# Patient Record
Sex: Female | Born: 1945
Health system: Southern US, Community
[De-identification: ages and names within clinical notes are randomized; demographics above are authoritative.]

## PROBLEM LIST (undated history)

## (undated) DIAGNOSIS — M810 Age-related osteoporosis without current pathological fracture: Secondary | ICD-10-CM

## (undated) DIAGNOSIS — R51 Headache: Secondary | ICD-10-CM

## (undated) DIAGNOSIS — K5790 Diverticulosis of intestine, part unspecified, without perforation or abscess without bleeding: Secondary | ICD-10-CM

## (undated) DIAGNOSIS — G4730 Sleep apnea, unspecified: Secondary | ICD-10-CM

## (undated) DIAGNOSIS — F329 Major depressive disorder, single episode, unspecified: Secondary | ICD-10-CM

## (undated) DIAGNOSIS — R9089 Other abnormal findings on diagnostic imaging of central nervous system: Secondary | ICD-10-CM

## (undated) DIAGNOSIS — Z8249 Family history of ischemic heart disease and other diseases of the circulatory system: Secondary | ICD-10-CM

## (undated) DIAGNOSIS — K219 Gastro-esophageal reflux disease without esophagitis: Secondary | ICD-10-CM

## (undated) DIAGNOSIS — E782 Mixed hyperlipidemia: Secondary | ICD-10-CM

## (undated) DIAGNOSIS — F32A Depression, unspecified: Secondary | ICD-10-CM

## (undated) DIAGNOSIS — I739 Peripheral vascular disease, unspecified: Secondary | ICD-10-CM

## (undated) HISTORY — DX: Other abnormal findings on diagnostic imaging of central nervous system: R90.89

## (undated) HISTORY — PX: ABDOMINAL HYSTERECTOMY: SHX81

## (undated) HISTORY — DX: Family history of ischemic heart disease and other diseases of the circulatory system: Z82.49

## (undated) HISTORY — PX: BLADDER SURGERY: SHX569

## (undated) HISTORY — DX: Age-related osteoporosis without current pathological fracture: M81.0

## (undated) HISTORY — DX: Diverticulosis of intestine, part unspecified, without perforation or abscess without bleeding: K57.90

## (undated) HISTORY — DX: Mixed hyperlipidemia: E78.2

## (undated) HISTORY — DX: Gastro-esophageal reflux disease without esophagitis: K21.9

## (undated) HISTORY — DX: Peripheral vascular disease, unspecified: I73.9

## (undated) HISTORY — DX: Headache: R51

## (undated) HISTORY — PX: MANDIBLE FRACTURE SURGERY: SHX706

## (undated) HISTORY — DX: Depression, unspecified: F32.A

## (undated) HISTORY — PX: CERVICAL SPINE SURGERY: SHX589

## (undated) HISTORY — DX: Major depressive disorder, single episode, unspecified: F32.9

---

## 1998-12-18 ENCOUNTER — Other Ambulatory Visit: Admission: RE | Admit: 1998-12-18 | Discharge: 1998-12-18 | Payer: Self-pay | Admitting: *Deleted

## 1999-12-24 ENCOUNTER — Other Ambulatory Visit: Admission: RE | Admit: 1999-12-24 | Discharge: 1999-12-24 | Payer: Self-pay | Admitting: *Deleted

## 2001-01-13 ENCOUNTER — Other Ambulatory Visit: Admission: RE | Admit: 2001-01-13 | Discharge: 2001-01-13 | Payer: Self-pay | Admitting: *Deleted

## 2002-02-01 ENCOUNTER — Other Ambulatory Visit: Admission: RE | Admit: 2002-02-01 | Discharge: 2002-02-01 | Payer: Self-pay | Admitting: *Deleted

## 2003-02-20 ENCOUNTER — Other Ambulatory Visit: Admission: RE | Admit: 2003-02-20 | Discharge: 2003-02-20 | Payer: Self-pay | Admitting: *Deleted

## 2004-02-28 ENCOUNTER — Other Ambulatory Visit: Admission: RE | Admit: 2004-02-28 | Discharge: 2004-02-28 | Payer: Self-pay | Admitting: *Deleted

## 2004-03-03 ENCOUNTER — Encounter: Admission: RE | Admit: 2004-03-03 | Discharge: 2004-03-03 | Payer: Self-pay | Admitting: *Deleted

## 2005-03-02 ENCOUNTER — Other Ambulatory Visit: Admission: RE | Admit: 2005-03-02 | Discharge: 2005-03-02 | Payer: Self-pay | Admitting: *Deleted

## 2006-03-23 ENCOUNTER — Other Ambulatory Visit: Admission: RE | Admit: 2006-03-23 | Discharge: 2006-03-23 | Payer: Self-pay | Admitting: *Deleted

## 2007-04-14 ENCOUNTER — Other Ambulatory Visit: Admission: RE | Admit: 2007-04-14 | Discharge: 2007-04-14 | Payer: Self-pay | Admitting: *Deleted

## 2007-05-27 ENCOUNTER — Ambulatory Visit: Payer: Self-pay | Admitting: Internal Medicine

## 2007-06-09 ENCOUNTER — Ambulatory Visit (HOSPITAL_COMMUNITY): Admission: RE | Admit: 2007-06-09 | Discharge: 2007-06-09 | Payer: Self-pay | Admitting: Internal Medicine

## 2007-06-09 ENCOUNTER — Ambulatory Visit: Payer: Self-pay | Admitting: Internal Medicine

## 2007-06-09 HISTORY — PX: COLONOSCOPY: SHX174

## 2008-06-20 ENCOUNTER — Ambulatory Visit (HOSPITAL_COMMUNITY): Admission: RE | Admit: 2008-06-20 | Discharge: 2008-06-20 | Payer: Self-pay | Admitting: Family Medicine

## 2008-07-30 ENCOUNTER — Other Ambulatory Visit: Admission: RE | Admit: 2008-07-30 | Discharge: 2008-07-30 | Payer: Self-pay | Admitting: Gynecology

## 2010-03-04 ENCOUNTER — Ambulatory Visit (HOSPITAL_COMMUNITY): Admission: RE | Admit: 2010-03-04 | Discharge: 2010-03-04 | Payer: Self-pay | Admitting: Family Medicine

## 2011-05-12 NOTE — Consult Note (Signed)
NAME:  ALIZON, SCHMELING                 ACCOUNT NO.:  [id number]   MEDICAL RECORD NO.:  [id number]           PATIENT TYPE:  AMB   LOCATION:                                FACILITY:  [facility]   PHYSICIAN:  R. Roetta Sessions, M.D. DATE OF BIRTH:  03/23/1942   DATE OF CONSULTATION:  05/27/2007  DATE OF DISCHARGE:                                 CONSULTATION   REASON FOR CONSULTATION:  Change in bowel habits.   REFERRING PHYSICIAN:  Scott A. Gerda Diss, M.D.   Dictation Ended At [employer]. Roetta Sessions, M.D.  Electronically Signed     RMR/MEDQ  D:  05/27/2007  T:  05/27/2007  Job:  [id number]

## 2011-05-12 NOTE — Op Note (Signed)
NAME:  Rachael Matthews, Rachael Matthews                 ACCOUNT NO.:  [id number]   MEDICAL RECORD NO.:  [id number]          PATIENT TYPE:  AMB   LOCATION:  DAY                           FACILITY:  [facility]   PHYSICIAN:  R. Roetta Sessions, M.D. DATE OF BIRTH:  Dec 10, 1946   DATE OF PROCEDURE:  06/09/2007  DATE OF DISCHARGE:                                PROCEDURE NOTE   PROCEDURE:  Diagnostic colonoscopy and ileoscopy.   ENDOSCOPIST:  Jonathon Bellows, M.D.   INDICATIONS FOR PROCEDURE:  [age]-year-old lady with a 4-month  history of intermittent postprandial diarrhea and other days she does  not have any diarrhea and days she does not have a bowel movement.  She  has not passed any blood per rectum.  She never had her lower GI tract  imaged.  There is no family history of colorectal neoplasia.  Colonoscopy is now being done.  The potential risks, benefits and  alternatives have been reviewed, questions answered.  She was having  some reflux symptoms when I saw her in the office without any alarm  features; those symptoms have been abolished with AcipHex 20 mg orally  daily.   PROCEDURE NOTE:  O2 saturation, blood pressure, pulse and respiration  were monitored throughout the entire procedure.   CONSCIOUS SEDATION:  Versed 4 mg IV, Demerol 75 mg IV in divided doses.   INSTRUMENT:  Olympus video chip system.   FINDINGS:  Digital rectal exam revealed no abnormalities.   ENDOSCOPIC FINDINGS:  The prep was adequate.   COLON:  The colonic mucosa was surveyed from the rectosigmoid junction  through the left, transverse and right colon to area of the appendiceal  orifice, ileocecal valve and cecum.  These structures were well seen and  photographed for the record.  The terminal ileum was then measured at 10  cm.  From this level, the scope slowly and cautiously withdrawn.  All  previously mentioned mucosal surfaces were again seen.  The colonic  mucosa appeared normal.  The scope was pulled down into to  the rectum,  where a thorough examination of the rectal mucosa including a  retroflexed view of the anal verge demonstrated only some anal papillae.  The patient tolerated the procedure well and was reactive after  endoscopy.   IMPRESSION:  Anal papillae, otherwise normal rectum, colon and terminal  ileum.   RECOMMENDATIONS:  We will begin a daily fiber supplement with Benefiber  one tablespoon daily.  She is to continue AcipHex 20 mg orally daily for  gastroesophageal reflux disease.  I plan to see this nice lady back in  followup in the office in 6-9 weeks to see how she is doing.      Jonathon Bellows, M.D.  Electronically Signed     RMR/MEDQ  D:  06/09/2007  T:  06/10/2007  Job:  [id number]

## 2011-05-12 NOTE — Consult Note (Signed)
NAME:  Rachael Matthews, Rachael Matthews                 ACCOUNT NO.:  [id number]   MEDICAL RECORD NO.:  [id number]           PATIENT TYPE:  AMB   LOCATION:                                FACILITY:  [facility]   PHYSICIAN:  R. Roetta Sessions, M.D. DATE OF BIRTH:  06/12/1946   DATE OF CONSULTATION:  05/27/2007  DATE OF DISCHARGE:                                 CONSULTATION   REASON FOR CONSULTATION:  Altered bowel habits, need for colonoscopy.   REFERRING PHYSICIAN:  Scott A. Gerda Diss, MD   HISTORY OF PRESENT ILLNESS:  Ms. Rachael Matthews is a very pleasant 65-year-  old Caucasian female sent over through the courtesy of Dr. Lilyan Punt  to further evaluate a 2-month to 1-year history of vague intermittent  postprandial abdominal cramps, followed by a strong urge to have a bowel  movement.  Symptoms are intermittent, not every day, but typically occur  after eating.  She may have a formed to semiformed to almost loose,  nonbloody bowel movement in rapid succession over subsequent 30 minutes  to an hour and may have 2 or 3 additional bowel movements, then symptoms  settle down.  There are days when she has 1 formed bowel movement  without any abdominal discomfort whatsoever.  There are even a few days  along the way where she does not have a bowel movement at all.  She has  never had any blood per rectum, no melena.  She has never had her lower  GI tract imaged.  There is no family history of colorectal neoplasia.   This nice lady tells me she has gained about 10 pounds in the past year.   She described also heartburn, which she has every morning and  intermittently throughout the day.  She has not had any odynophagia or  dysphagia.  She had been on Boniva previously.  After taking oral Boniva  she had significant worsening of her reflux symptoms.  Although they  have now settled down, they still persist.  She had been taking Zantac  150 mg orally twice daily.  She does not smoke or use alcohol.   PAST MEDICAL  HISTORY:  1. Some memory problems.  2. Hypercholesterolemia.   PAST SURGERY:  1. Hysterectomy.  2. Bladder repair.  3. Cervical spine surgery.   CURRENT MEDICATIONS:  1. Cymbalta 30 mg daily.  2. Zantac 150 mg b.i.d.  3. Meloxicam 7.5 mg b.i.d.  4. Amoxicillin 500 mg daily.  5. Claritin daily.  6. Os-Cal 500 mg b.i.d.  7. Coenzyme Q-10 100 mg b.i.d.  8. Vitamin E 400 units daily.  9. Omega-3 fish oil three capsules daily.  10.Super B complex daily.  11.Red rice yeast 600 mg two daily.   ALLERGIES:  CODEINE, PHENERGAN.   FAMILY HISTORY:  Her father died with MI at age 81.  Mother is alive at  age 7 with congestive heart failure and hypertension.  No history of  chronic GI or liver illness.   SOCIAL HISTORY:  The patient is married.  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  No  tobacco, no alcohol, no illicit drugs.   REVIEW OF SYSTEMS:  No recent chest pain or dyspnea on exertion.  Weight  gain as outlined above.  No fever or chills.   PHYSICAL EXAMINATION:  GENERAL:  This is a pleasant 65 year old lady  resting comfortably.  VITAL SIGNS:  Weight 145.  Height 5 feet.  Temperature 97.9, BP 128/80,  pulse 72.  SKIN:  Warm and dry.  There is no jaundice or cutaneous stigmata of  chronic liver disease.  HEENT:  No scleral icterus.  JVD is not prominent.  Oral cavity:  No  lesions.  CHEST:  Lungs are clear to auscultation.  CARDIAC:  Regular rate and rhythm without murmur, gallop, or rub.  ABDOMEN:  Nondistended, positive bowel sounds, soft, nontender, without  appreciable mass or organomegaly.  EXTREMITIES:  No edema.  RECTAL:  Deferred to time of colonoscopy.   IMPRESSION:  Ms. Rachael Matthews is a very pleasant 65 year old lady with a  65-month to year history of intermittent postprandial diarrhea.  Symptoms  are intermittent.  Abdominal cramps are temporally related to the need  to have a bowel movement and are  relieved with defecation.  There are  days when she has no symptoms and normal bowel movements.  There are  days in between when she has no bowel movement.  Almost certainly this  points to a functional disorder.   I am struck by the fact that she is 45 and has never had her lower GI  tract imaged.  Before making further recommendations, I have strongly  encouraged Ms. Ringwald to go ahead and have a screening colonoscopy.  The  potential risks, benefits, and alternatives of this approach have been  reviewed.  Her questions were answered.  She was agreeable.  I will plan  to perform a colonoscopy in the very near future.  She does have  frequent reflux symptoms, which were significantly worsened with a  course of Boniva.  I do not detect any alarm features at this point in  time.  I have recommended that Ms. Polidore simply go on a proton pump  inhibitor for the next month and see how that works for this nice lady.  I have taken the liberty of giving her 30 20-mg Aciphex tablets one  tablet  each morning.  I have also given her literature on gastroesophageal  reflux disease.  I will make further recommendations in the very near  future.   I would like to thank Dr. Lilyan Punt for allowing me to see this nice  lady today.      Jonathon Bellows, M.D.  Electronically Signed     RMR/MEDQ  D:  05/27/2007  T:  05/27/2007  Job:  [id number]   cc:   Lorin Picket A. Gerda Diss, MD  Fax: [phone]

## 2011-07-23 ENCOUNTER — Ambulatory Visit: Payer: Self-pay | Admitting: Cardiology

## 2011-07-31 ENCOUNTER — Encounter: Payer: Self-pay | Admitting: Cardiology

## 2011-07-31 ENCOUNTER — Ambulatory Visit (INDEPENDENT_AMBULATORY_CARE_PROVIDER_SITE_OTHER): Payer: 59 | Admitting: Cardiology

## 2011-07-31 DIAGNOSIS — Z8249 Family history of ischemic heart disease and other diseases of the circulatory system: Secondary | ICD-10-CM

## 2011-07-31 DIAGNOSIS — E782 Mixed hyperlipidemia: Secondary | ICD-10-CM

## 2011-07-31 DIAGNOSIS — R0602 Shortness of breath: Secondary | ICD-10-CM

## 2011-07-31 NOTE — Assessment & Plan Note (Signed)
Progressive over the last 6 months, also associated with fatigue, no exertional chest pain. She has had some exertional dizziness. Resting ECG is normal. Family history of premature cardiovascular disease is noted, as well as personal history of significant hyperlipidemia, with statin intolerance demonstrated. No significant cardiac murmur on exam. Plan is to proceed with an exercise echocardiogram for ischemic evaluation. Followup arranged.

## 2011-07-31 NOTE — Assessment & Plan Note (Signed)
Significant, as noted above. We discussed the situation today, reviewed previous medications that she has attempted for treatment, and plan at this point is to refer her to our lipid clinic for further evaluation and management.

## 2011-07-31 NOTE — Patient Instructions (Signed)
Your physician has requested that you have en exercise stress myoview. For further information please visit [email or web address] Please follow instruction sheet, as given.  Your physician is referring you to our Lipid Clinic   Your physician recommends that you schedule a follow-up appointment in: 1 month

## 2011-07-31 NOTE — Progress Notes (Signed)
Clinical Summary Rachael Matthews is a 65 y.o.female referred for cardiology consultation by Dr. Gerda Diss. She reports a six-month history of progressive exertional fatigue. Although she admits that she has not been as regular with exercise, with her typical activities she has been more fatigued, sometimes "exhausted", other times dizzy. She reports this with activities such as walking on the beach, going up steps, and recently trying to walk on the treadmill. She does not endorse any exertional chest pain.  Recent lab work from June showed total cholesterol 316, HDL 46, triglycerides 145, LDL 241, glucose 90, BUN 18, creatinine 0.6, sodium 142, potassium 4.4, AST 16, ALT 10. She states that she took Pravachol years ago, reportedly without difficulty, however was subsequently switched to Lipitor, and then Vytorin, having to discontinue these medications related to a feeling of dizziness and also poor memory. She has been hesitant to resume any additional statin therapy. She is taking omega-3 supplements intermittently.  Fax copy of ECG from July 2 shows normal sinus rhythm at 68 beats per minute.  She does have family history of premature cardiovascular disease, noted below. She reports no previous ischemic testing.   No Known Allergies  Medication list reviewed.  Past Medical History  Diagnosis Date  . Mixed hyperlipidemia   . GERD (gastroesophageal reflux disease)   . Depression     Past Surgical History  Procedure Date  . Abdominal hysterectomy   . Bladder surgery   . Cervical spine surgery     Family History  Problem Relation Age of Onset  . Coronary artery disease Father     Died with MI age 95  . Hypertension Mother   . Heart failure Mother     Social History Ms. Marshburn reports that she has never smoked. She has never used smokeless tobacco. Ms. Rahimi reports that she does not drink alcohol.  Review of Systems No palpitations or syncope. No orthopnea or PND. No lower extremity  edema. Appetite stable. Otherwise negative.  Physical Examination Filed Vitals:   07/31/11 0843  BP: 128/73  Pulse: 69  Normally nourished appearing woman in no acute distress. HEENT: Conjunctiva and lids normal, oropharynx with moist mucosa. Neck: Supple, no elevated JVP or carotid bruits. Lungs: Clear to auscultation, nonlabored. Cardiac: Regular rate and rhythm, no significant murmur or S3 gallop. Abdomen: Soft, nontender, no bruits. Skin: Warm and dry. Extremities: No pitting edema, distal pulses full. Musculoskeletal: No kyphosis. Neuropsychiatric: Alert and oriented x3, affect appropriate.   ECG Normal sinus rhythm at 64 beats per minute.    Problem List and Plan

## 2011-08-10 ENCOUNTER — Ambulatory Visit (INDEPENDENT_AMBULATORY_CARE_PROVIDER_SITE_OTHER): Payer: Medicare Other

## 2011-08-10 VITALS — BP 132/94 | Wt 134.0 lb

## 2011-08-10 DIAGNOSIS — E782 Mixed hyperlipidemia: Secondary | ICD-10-CM

## 2011-08-10 MED ORDER — FENOFIBRATE 145 MG PO TABS: 145.0000 mg | ORAL_TABLET | Freq: Every day | ORAL | Status: DC

## 2011-08-10 NOTE — Progress Notes (Signed)
Rachael Matthews is a 65yoF presenting for her initial Lipid Clinic visit after being referred by Dr. Diona Browner. Her most recent labs were: TChol 316, Trig 145, HDL 46, LDL 241, AST 16, ALT 10.   Patient has no significant medical history and is not taking any medications regularly. Patient reports previously having tried Pravachol, Lipitor, Vytorin, niacin and red yeast rice for her hyperlipidemia, but discontinued the medications due to significant memory problems that presented while on the medications (couldn't remember how to do work tasks, how to pay bills, and was required to retire early). Patient has also tried omega-3-fatty acids but did not like taking them. Memory problems have improved since discontinuing the cholesterol medications but also reports having some issues with other medications (such as Advil liquid-gels). She is willing to try other cholesterol medications, but does not want to restart a statin.  FH: Father died of a massive MI at 38yo; Mother (living) had a triple bi-pass in her 57's and has HTN, hyperlipidemia, alzheimer's; One brother had quadruple bi-pass and HTN; and a sister and other brother have HTN  SH: Currently works part-time 5 months of the year; Denies any EtOH and smoking (smoked as a teenager)  Review of Diet: Breakfast: plain yogurt chopped strawberries, oatmeal, or occasional boiled egg. Lunch: If working, salad or pizza (pizza/salad days split half/half); when at home, usually skips or has crackers with peanut butter. Dinner: salads with grilled chicken (with thousand island dressing), fish, steak, pork chops, occasionally pasta. Rarely eats fried foods. For a snack, she will occasionally have cookies.  Review of exercise: No regular exercise; reports having no restrictions for exercise but that she just doesn't do it. Has a treadmill at home but gets bored using it; had a gym membership in the past but does not have one currently. Reports feeling unsafe to walk by  herself around her house. Discussed tried to get her husband involved in exercise/walking with her.  Current Outpatient Prescriptions on File Prior to Visit  Medication Sig Dispense Refill  . Ibuprofen 200 MG CAPS Take by mouth as needed.         No Known Allergies

## 2011-08-10 NOTE — Assessment & Plan Note (Signed)
Assessment: Rachael Matthews is a 65yoF with uncontrolled hyperlipidemia. TChol 316 (Goal <200), LDL 241 (Goal <130),Trig 145 (Goal <150), HDL 46 (Goal >45). Patient is unable to tolerate statins (experiences memory problems), but is willing to try other medications. Patient diet is fairly healthy, but she does not exercise. We discussed healthier dietary options (leaner meats, less/low fat salad dressing, lower fat) and different options for exercise--cardio, strength training, getting her husband involved, and possibly getting a gym membership again.  Plan: 1) Start taking Tricor 145mg  tablets: One tablet by mouth daily (#42 tablets SAMPLE given). 2) Increase exercise up to 30 min x 5 days/week as tolerated. (Patient's goal: increase walking to 2 miles/day most days of the week). 3) Continue to try to choose healthier diet options. 4) Follow-up 09/24/11 at 10am; Get fasting labs drawn a few days prior to clinic visit (prescription for labs given to patient).

## 2011-08-10 NOTE — Patient Instructions (Signed)
1) Start taking Tricor 145mg  tablets by mouth once daily. 2) Try to increase weekly exercise to 5 days per week for 30 minutes a week as tolerated.  (Your Goal: work up to 2 miles per day most days of the week). 3) Continue choosing healthy dietary options. 4) Lipid Clinic follow-up in 6 weeks (09/24/11 at 10am). Get blood work a few days before your appointment.

## 2011-08-11 ENCOUNTER — Ambulatory Visit (HOSPITAL_COMMUNITY)
Admission: RE | Admit: 2011-08-11 | Discharge: 2011-08-11 | Disposition: A | Discharge status: Home / Self Care | Payer: Medicare Other | Source: Ambulatory Visit | Attending: Cardiology | Admitting: Cardiology

## 2011-08-11 ENCOUNTER — Encounter (HOSPITAL_COMMUNITY): Payer: Self-pay | Admitting: Cardiology

## 2011-08-11 DIAGNOSIS — R0602 Shortness of breath: Secondary | ICD-10-CM

## 2011-08-11 DIAGNOSIS — R0989 Other specified symptoms and signs involving the circulatory and respiratory systems: Secondary | ICD-10-CM | POA: Insufficient documentation

## 2011-08-11 DIAGNOSIS — R0609 Other forms of dyspnea: Secondary | ICD-10-CM | POA: Insufficient documentation

## 2011-08-11 NOTE — Progress Notes (Signed)
Stress Lab Nurses Notes - Khadijah Mastrianni 08/11/2011  Reason for doing test: Dyspnea  Type of test: Stress Echo  Nurse performing test: Parke Poisson, RN  Nuclear Medicine Tech: Not Applicable  Echo Tech: Karrie Doffing  MD performing test: R. Dietrich Pates  Family MD: Lilyan Punt  Test explained and consent signed: yes  IV started: No IV started  Symptoms: Fatigue and mild SOB  Treatment/Intervention: None  Reason test stopped: fatigue  After recovery IV was: NA  Patient to return to Nuc. Med at :NA  Patient discharged: Home  Patient's Condition upon discharge was: stable  Comments: Symptoms resolved in recovery.  Erskine Speed T

## 2011-08-11 NOTE — Progress Notes (Signed)
*  PRELIMINARY RESULTS* Echocardiogram Echocardiogram Stress Test has been performed.  Rachael Matthews 08/11/2011, 11:26 AM

## 2011-09-03 ENCOUNTER — Ambulatory Visit: Payer: 59 | Admitting: Cardiology

## 2011-09-24 ENCOUNTER — Ambulatory Visit: Payer: Medicare Other

## 2011-09-25 ENCOUNTER — Encounter: Payer: Self-pay | Admitting: Cardiology

## 2012-10-04 ENCOUNTER — Other Ambulatory Visit: Payer: Self-pay | Admitting: Family Medicine

## 2012-10-04 DIAGNOSIS — R270 Ataxia, unspecified: Secondary | ICD-10-CM

## 2012-10-06 ENCOUNTER — Ambulatory Visit (HOSPITAL_COMMUNITY)
Admission: RE | Admit: 2012-10-06 | Discharge: 2012-10-06 | Disposition: A | Discharge status: Home / Self Care | Payer: Medicare Other | Source: Ambulatory Visit | Attending: Family Medicine | Admitting: Family Medicine

## 2012-10-06 DIAGNOSIS — R42 Dizziness and giddiness: Secondary | ICD-10-CM | POA: Insufficient documentation

## 2012-10-06 DIAGNOSIS — R270 Ataxia, unspecified: Secondary | ICD-10-CM

## 2012-10-06 DIAGNOSIS — R279 Unspecified lack of coordination: Secondary | ICD-10-CM | POA: Insufficient documentation

## 2012-12-23 ENCOUNTER — Other Ambulatory Visit (HOSPITAL_COMMUNITY): Payer: Self-pay | Admitting: Neurology

## 2012-12-23 DIAGNOSIS — I6330 Cerebral infarction due to thrombosis of unspecified cerebral artery: Secondary | ICD-10-CM

## 2013-01-05 ENCOUNTER — Ambulatory Visit (HOSPITAL_COMMUNITY): Payer: Medicare Other | Attending: Cardiology

## 2013-01-05 DIAGNOSIS — I369 Nonrheumatic tricuspid valve disorder, unspecified: Secondary | ICD-10-CM

## 2013-01-05 DIAGNOSIS — I379 Nonrheumatic pulmonary valve disorder, unspecified: Secondary | ICD-10-CM | POA: Insufficient documentation

## 2013-01-05 DIAGNOSIS — I059 Rheumatic mitral valve disease, unspecified: Secondary | ICD-10-CM | POA: Insufficient documentation

## 2013-01-05 DIAGNOSIS — I6330 Cerebral infarction due to thrombosis of unspecified cerebral artery: Secondary | ICD-10-CM

## 2013-01-05 NOTE — Progress Notes (Signed)
Echocardiogram performed.  

## 2013-01-06 ENCOUNTER — Encounter (HOSPITAL_COMMUNITY): Payer: Self-pay | Admitting: Neurology

## 2013-02-20 ENCOUNTER — Encounter: Payer: Self-pay | Admitting: Nurse Practitioner

## 2013-02-20 DIAGNOSIS — I6330 Cerebral infarction due to thrombosis of unspecified cerebral artery: Secondary | ICD-10-CM

## 2013-02-20 DIAGNOSIS — R9409 Abnormal results of other function studies of central nervous system: Secondary | ICD-10-CM | POA: Insufficient documentation

## 2013-03-22 ENCOUNTER — Ambulatory Visit (INDEPENDENT_AMBULATORY_CARE_PROVIDER_SITE_OTHER): Payer: Medicare Other | Admitting: Nurse Practitioner

## 2013-03-22 ENCOUNTER — Encounter: Payer: Self-pay | Admitting: Nurse Practitioner

## 2013-03-22 VITALS — BP 130/75 | HR 73 | Ht 60.5 in | Wt 129.0 lb

## 2013-03-22 DIAGNOSIS — R42 Dizziness and giddiness: Secondary | ICD-10-CM | POA: Insufficient documentation

## 2013-03-22 DIAGNOSIS — I6330 Cerebral infarction due to thrombosis of unspecified cerebral artery: Secondary | ICD-10-CM

## 2013-03-22 MED ORDER — TOPIRAMATE 25 MG PO TABS: 25.0000 mg | ORAL_TABLET | Freq: Every day | ORAL | Status: DC

## 2013-03-22 NOTE — Patient Instructions (Addendum)
Decrease Topamax to 25 mg daily Please keep a record of your spells of intermittent lightheadedness, dizziness, spinning sensation and bring to next visit Continue aspirin for small vessel disease Followup in 3 months

## 2013-03-22 NOTE — Progress Notes (Signed)
HPI:  Patient returns for followup after last visit in January 2014  with continued episodes of light headedness, dizziness, spinning sensation and headache. These episodes are brief and she was placed on Topamax and currently at 50 mg. Her episodes have decreased however she is having some tingling of the face which is concerning her. She was made aware this is a side effect of the medication. She would like to decrease the dose. She denies any visual loss stroke like symptoms, she has not fallen.  ROS:   memory, fatigue, itching, feeling cold  Physical Exam General: well developed, well nourished, seated, in no evident distress Head: head normocephalic and atraumatic. Oropharynx benign Neck: supple with no carotid or supraclavicular bruits Cardiovascular: regular rate and rhythm, no murmurs  Neurologic Exam Mental Status: Awake and fully alert. Oriented to place and time. Recent and remote memory intact. Attention span, concentration and fund of knowledge appropriate. Mood and affect appropriate.  Cranial Nerves:  Pupils equal, briskly reactive to light. Extraocular movements full without nystagmus. Visual fields full to confrontation. Hearing intact and symmetric to finger snap. Facial sensation intact. Face, tongue, palate move normally and symmetrically. Neck flexion and extension normal.  Motor: Normal bulk and tone. Normal strength in all tested extremity muscles. Sensory.: intact to touch and pinprick and vibratory.  Coordination: Rapid alternating movements normal in all extremities. Finger-to-nose and heel-to-shin performed accurately bilaterally. Gait and Station: Arises from chair without difficulty. Stance is normal. Gait demonstrates normal stride length and balance . Able to heel, toe and tandem walk without difficulty.  Reflexes: 2+ and symmetric. Toes downgoing.     ASSESSMENT: Episodes of intermittent lightheadedness, dizziness, spinning sensation with history of migraine.  Topamax has decreased these spells.    PLAN: Decrease Topamax to 25 mg at night Keep daily record of episodes of dizziness and lightheadedness and bring to next appointment Continue aspirin for small vessel disease Followup in 3 months    Nilda Riggs, GNP-BC APRN

## 2013-03-23 ENCOUNTER — Encounter: Payer: Self-pay | Admitting: Nurse Practitioner

## 2013-04-19 ENCOUNTER — Telehealth: Payer: Self-pay | Admitting: Family Medicine

## 2013-04-19 NOTE — Telephone Encounter (Signed)
Patient notified to stick with the Protonix for the next 3 months and Dr. Lorin Picket recommends following up by that point in time. Then he can discuss whether or not to start Fosamax or go with the alternative treatment for the osteoporosis.if worse certainly notify us we may need to see her sooner. Patient verbalized understanding.

## 2013-04-19 NOTE — Telephone Encounter (Signed)
Patient would like to speak with a nurse regarding one of the medications that she was put on.

## 2013-04-19 NOTE — Telephone Encounter (Signed)
Stick with the Protonix for the next 3 months I would recommend following up by that point in time. Then we can discuss whether or not to start Fosamax or go with the alternative treatment for the osteoporosis.if worse certainly notify us we may need to see her sooner.

## 2013-04-19 NOTE — Telephone Encounter (Signed)
Patient states that she was told to call and let you know that her symptoms have gotten a lot better since she has been taking the protonix but not good enough to start back on the Fosamax. She states the only complaint she has is that it "feels like a fist is in the pit of her stomach at times." She states the burning sensation is completely gone.

## 2013-04-26 ENCOUNTER — Telehealth: Payer: Self-pay | Admitting: Family Medicine

## 2013-04-26 NOTE — Telephone Encounter (Signed)
Patient needs a refill of her alprazolam to [facility].

## 2013-04-26 NOTE — Telephone Encounter (Signed)
RX refills called in to [facility]. Patient notified.

## 2013-04-26 NOTE — Telephone Encounter (Signed)
Okay x2 total

## 2013-04-28 ENCOUNTER — Encounter: Payer: Self-pay | Admitting: *Deleted

## 2013-05-31 ENCOUNTER — Other Ambulatory Visit: Payer: Self-pay | Admitting: Nurse Practitioner

## 2013-06-22 ENCOUNTER — Ambulatory Visit: Payer: Medicare Other | Admitting: Neurology

## 2013-07-11 ENCOUNTER — Encounter: Payer: Self-pay | Admitting: Family Medicine

## 2013-07-11 ENCOUNTER — Ambulatory Visit (INDEPENDENT_AMBULATORY_CARE_PROVIDER_SITE_OTHER): Payer: Medicare Other | Admitting: Family Medicine

## 2013-07-11 VITALS — BP 110/72 | HR 80 | Wt 127.0 lb

## 2013-07-11 DIAGNOSIS — E785 Hyperlipidemia, unspecified: Secondary | ICD-10-CM

## 2013-07-11 DIAGNOSIS — Z79899 Other long term (current) drug therapy: Secondary | ICD-10-CM

## 2013-07-11 MED ORDER — ROSUVASTATIN CALCIUM 5 MG PO TABS: 5.0000 mg | ORAL_TABLET | Freq: Every day | ORAL | Status: DC

## 2013-07-11 MED ORDER — PANTOPRAZOLE SODIUM 40 MG PO TBEC: 40.0000 mg | DELAYED_RELEASE_TABLET | Freq: Every day | ORAL | Status: DC

## 2013-07-11 NOTE — Progress Notes (Signed)
  Subjective:    Patient ID: Rachael Matthews, female    DOB: [birth year], 67 y.o.   MRN: [id number]  Hyperlipidemia This is a chronic problem. The current episode started more than 1 year ago. The problem is uncontrolled. Recent lipid tests were reviewed and are high. There are no known factors aggravating her hyperlipidemia. She is currently on no antihyperlipidemic treatment. The current treatment provides no improvement of lipids. There are no compliance problems.  There are no known risk factors for coronary artery disease.  Patient wants to discuss lab results. Patient states that she is still having some abdominal discomfort and burning.  She has a history of hyperlipidemia  Review of Systems Denies chest tightness shortness breath swelling in the legs , no vomiting or diarrhea no bloody stools    Objective:   Physical Exam Lungs are clear no crackles heart is regular no murmurs pulse is normal extremities no edema skin warm dry       Assessment & Plan:  Hyperlipidemia-patient did not tolerate statins in the past but her LDL is still elevated that is dangerous for her. I recommend trying Crestor 5 mg sugars start off one on Monday Wednesdays and Fridays. She will followup with lab work in 4 weeks in the office visit in 6 weeks

## 2013-07-11 NOTE — Patient Instructions (Signed)

## 2013-07-12 ENCOUNTER — Telehealth: Payer: Self-pay | Admitting: Family Medicine

## 2013-07-12 NOTE — Telephone Encounter (Signed)
Crestor was called into [facility]. Patient was notified.

## 2013-07-12 NOTE — Telephone Encounter (Signed)
Patient came in here yesterday and was supposed to get Rx for Crestor, but [facility] says they never got it. Please call in.

## 2013-07-25 ENCOUNTER — Encounter: Payer: Self-pay | Admitting: Family Medicine

## 2013-08-22 ENCOUNTER — Encounter: Payer: Self-pay | Admitting: Family Medicine

## 2013-08-22 ENCOUNTER — Ambulatory Visit (INDEPENDENT_AMBULATORY_CARE_PROVIDER_SITE_OTHER): Payer: Medicare Other | Admitting: Family Medicine

## 2013-08-22 VITALS — BP 118/72 | Ht 60.0 in | Wt 127.2 lb

## 2013-08-22 DIAGNOSIS — E785 Hyperlipidemia, unspecified: Secondary | ICD-10-CM

## 2013-08-22 NOTE — Progress Notes (Signed)
  Subjective:    Patient ID: Rachael Matthews, female    DOB: 1946/11/28, 67 y.o.   MRN: [id number]  HPI Here for f/u appt from blood work.  Pt is still having issues with the Crestor.  Long discussion held regarding treatment options. Patient has had side effects with all statins. With Crestor causes focus in thinking issues. I advised patient it would be best to stop the medicine she R. he had several weeks ago. She understands her cholesterol posterior iris for strokes and heart attack there is nothing else out on the market that will significantly lower her readings. PMH hyperlipidemia Patient does not smoke   Review of Systems See above lungs lungs are clear hearts regular pulse normal blood pressure good    Objective:   Physical Exam  See above      Assessment & Plan:  Her readings were reviewed. I recommend see her back in 6 months. If she is having further problems sooner followup. I did tell the patient that there are some studies going on at [facility] regarding optional treatments we will try to look into those  For now no statins just healthy diet and exercise

## 2013-08-24 ENCOUNTER — Other Ambulatory Visit: Payer: Self-pay | Admitting: Neurology

## 2013-10-03 ENCOUNTER — Ambulatory Visit (INDEPENDENT_AMBULATORY_CARE_PROVIDER_SITE_OTHER): Payer: Medicare Other | Admitting: Neurology

## 2013-10-03 ENCOUNTER — Encounter: Payer: Self-pay | Admitting: Neurology

## 2013-10-03 VITALS — BP 118/74 | HR 77 | Ht 60.0 in | Wt 124.0 lb

## 2013-10-03 DIAGNOSIS — R42 Dizziness and giddiness: Secondary | ICD-10-CM

## 2013-10-03 DIAGNOSIS — E782 Mixed hyperlipidemia: Secondary | ICD-10-CM

## 2013-10-03 DIAGNOSIS — R0602 Shortness of breath: Secondary | ICD-10-CM

## 2013-10-03 DIAGNOSIS — I6330 Cerebral infarction due to thrombosis of unspecified cerebral artery: Secondary | ICD-10-CM

## 2013-10-03 NOTE — Progress Notes (Signed)
  History of Present Illness:  Rachael Matthews is a 67 years old right-handed Caucasian female, referred by her primary care physician for evaluation of abnormal MRI scan. .   She denied previous history of hypertension or diabetes or hyperlipidemia, she is only taking Xanax as needed for anxiety  Since 2012, she has intermittent lightheaded, dizziness, spinning sensation, mild unsteady gait, symptoms were intermittent, there was no significant interruption in her daily activity, she still works part time,  She did not have previous history of visual loss, no stroke like symptoms, no low back pain, no persistent gait difficulty.  MRI showed mild to moderate small vessel disease.  2D echo was normal, her carotid doppler normal. She reports she still have episodes intermittent lightheaded, dizziness, spinning sensation, mild unsteady gait.  She has history of migraine and until a month ago had not had a migraine in years. Her episodes are brief but occur on daily basis.   She continued to complains intermittent dizziness, higher dose of Topamax exacerbate her symptoms, she is only taking 25 mg every night, was not sure the benefit of the medications, she works part-time job, denies significant gait difficulty, but few on balance with sudden turning, no hearing loss,   Review of system: a complete 14 system review, the patient complains of only the following symptoms, and all other reviewed systems are negative.  Neurological: Memory loss   Headache   Dizziness   Allergy/Immunology: Allergies     Social History  Patient lives at home with her husband. Patient is retired. Patient denies any history of tobacco, alcohol and illicit drugs. Patient drinks two cups of caffeine daily. Inhaled Tobacco Use: never smoker  Family History  Patients parents are both deceased. Liver Cancer  Past Medical History  High cholesterol, migraines  Surgical History  Neck  Surg. Hysterectomy Bladder Jaw Is  Physical Exam  Neck: supple no carotid bruits Respiratory: clear to auscultation bilaterally Cardiovascular: regular rate rhythm  Neurologic Exam  Mental Status: pleasant, awake, alert, cooperative to history, talking, and casual conversation. Cranial Nerves: CN II-XII pupils were equal round reactive to light.  Fundi were sharp bilaterally.  Extraocular movements were full.  Visual fields were full on confrontational test.  Facial sensation and strength were normal.  Hearing was intact to finger rubbing bilaterally.  Uvula tongue were midline.  Head turning and shoulder shrugging were normal and symmetric.  Tongue protrusion into the cheeks strength were normal.  Motor: Normal tone, bulk, and strength. Sensory: Normal to light touch, pinprick, proprioception, and vibratory sensation. Coordination: Normal finger-to-nose, heel-to-shin.  There was no dysmetria noticed. Gait and Station: Narrow based and steady, was able to perform tiptoe, heel, and tandem walking without difficulty.  Reflexes: Deep tendon reflexes: Biceps: 2/2, Brachioradialis: 2/2, Triceps: 2/2, Pateller: 2+/2+, Achilles: 2/2.  Plantar responses are flexor.  Assessment and Plan:  67 years old Caucasian female, with abnormal MRI scan, most consistent with moderate to severe small vessel disease. She continues to have intermittent lightheadedness, dizziness, spinning sensation, mild unsteady gait. She has history of migraine .    Nonspecific complains, higher dose of Topamax caused worsening dizziness, also complains of memory trouble, she is only taking 25 mg every night, less likely it was helping her, will stop the Topamax, continue moderate exercise, only return to clinic as needed

## 2013-11-15 ENCOUNTER — Encounter: Payer: Self-pay | Admitting: Nurse Practitioner

## 2013-11-15 ENCOUNTER — Ambulatory Visit (INDEPENDENT_AMBULATORY_CARE_PROVIDER_SITE_OTHER): Payer: Medicare Other | Admitting: Nurse Practitioner

## 2013-11-15 VITALS — BP 112/78 | Ht 60.0 in | Wt 125.8 lb

## 2013-11-15 DIAGNOSIS — Z23 Encounter for immunization: Secondary | ICD-10-CM

## 2013-11-15 DIAGNOSIS — Z0000 Encounter for general adult medical examination without abnormal findings: Secondary | ICD-10-CM

## 2013-11-15 DIAGNOSIS — E785 Hyperlipidemia, unspecified: Secondary | ICD-10-CM

## 2013-11-15 DIAGNOSIS — Z79899 Other long term (current) drug therapy: Secondary | ICD-10-CM

## 2013-11-15 DIAGNOSIS — Z01419 Encounter for gynecological examination (general) (routine) without abnormal findings: Secondary | ICD-10-CM

## 2013-11-15 MED ORDER — COLESEVELAM HCL 3.75 G PO PACK: PACK | ORAL | Status: DC

## 2013-11-15 NOTE — Patient Instructions (Signed)
Calcium 1500 mg per day Vitamin D 1000 units

## 2013-11-19 ENCOUNTER — Encounter: Payer: Self-pay | Admitting: Nurse Practitioner

## 2013-11-19 DIAGNOSIS — M810 Age-related osteoporosis without current pathological fracture: Secondary | ICD-10-CM | POA: Insufficient documentation

## 2013-11-19 NOTE — Progress Notes (Signed)
  Subjective:    Patient ID: Rachael Matthews, female    DOB: [birth year], 67 y.o.   MRN: [id number]  HPI Presents for wellness checkup. Has been off Welchol for a long time. Not on any medication for osteoporosis. Regular vision and dental exams. No new sexual partners. No pelvic pain.    Review of Systems  Constitutional: Negative for activity change, appetite change and fatigue.  HENT: Negative for dental problem, ear pain and sore throat.   Eyes: Negative for visual disturbance.  Respiratory: Negative for cough, chest tightness, shortness of breath and wheezing.   Cardiovascular: Negative for chest pain and leg swelling.  Gastrointestinal: Negative for nausea, vomiting, abdominal pain, diarrhea, constipation and blood in stool.  Genitourinary: Negative for dysuria, urgency, frequency, vaginal discharge, enuresis, difficulty urinating and pelvic pain.       Objective:   Physical Exam  Constitutional: She is oriented to person, place, and time. She appears well-developed. No distress.  HENT:  Right Ear: External ear normal.  Left Ear: External ear normal.  Mouth/Throat: Oropharynx is clear and moist.  Neck: Normal range of motion. Neck supple. No tracheal deviation present. No thyromegaly present.  Cardiovascular: Normal rate, regular rhythm and normal heart sounds.  Exam reveals no gallop.   No murmur heard. Pulmonary/Chest: Effort normal and breath sounds normal.  Abdominal: Soft. She exhibits no distension. There is no tenderness.  Genitourinary: Vagina normal and uterus normal. No vaginal discharge found.  Musculoskeletal: She exhibits no edema.  Lymphadenopathy:    She has no cervical adenopathy.  Neurological: She is alert and oriented to person, place, and time.  Skin: Skin is warm and dry. No rash noted.  Psychiatric: She has a normal mood and affect. Her behavior is normal.  Correction to above: Vaginal normal with no discharge. No uterus; has had hysterectomy.  Bimanual  exam: normal. Rectal exam: normal; no stool for hemoccult. Breast exam: no masses; axillae no adenopathy.        Assessment & Plan:  Well woman exam - Plan: Pneumococcal polysaccharide vaccine 23-valent greater than or equal to 2yo subcutaneous/IM, POC Hemoccult Bld/Stl (3-Cd Home Screen)  High risk medication use - Plan: Hepatic function panel  Hyperlipemia - Plan: Lipid panel  Need for prophylactic vaccination and inoculation against influenza  Patient to schedule her own appt for mammogram at [facility]. Restart Welchol as directed. Repeat labs in 2-3 months. Discussed risks of hyperlipidemia especially with her family history. Recommend regular exercise, healthy diet and daily calcium and vitamin D supplementation. Patient defers medication for osteoporosis.  Next physical in one year.

## 2013-12-22 ENCOUNTER — Encounter: Payer: Self-pay | Admitting: Family Medicine

## 2014-06-08 ENCOUNTER — Telehealth: Payer: Self-pay | Admitting: Family Medicine

## 2014-06-08 NOTE — Telephone Encounter (Signed)
Review labs. Patient had done at [facility].

## 2014-06-11 NOTE — Telephone Encounter (Signed)
Discussed with patient. Transferred to front desk to schedule office visit.

## 2014-06-11 NOTE — Telephone Encounter (Signed)
Please the patient labs that were sent. She is aware of her cholesterol is significantly elevated. It would be a good idea for her to followup somewhere in the next 3-6 weeks for further discussion and recheck. ( Of note her total cholesterol was 301, triglycerides 193, LDL 217 , this patient has not tolerated statins in the past.)

## 2014-06-20 ENCOUNTER — Telehealth: Payer: Self-pay | Admitting: Family Medicine

## 2014-06-20 NOTE — Telephone Encounter (Signed)
Review recent labs

## 2014-06-20 NOTE — Telephone Encounter (Signed)
Please let the patient know that I reviewed over her lab work. Sugar looks good. Kidney function looks good. Hemoglobin looked good. Vitamin D level was low at 19. B12 level normal. Patient should consider followup office visit this summer. 2000 international units vitamin D daily is recommended.

## 2014-06-20 NOTE — Telephone Encounter (Signed)
Left message to return call 

## 2014-06-20 NOTE — Telephone Encounter (Signed)
Results discussed with patient. Patient verbalized understanding.

## 2014-07-06 ENCOUNTER — Encounter: Payer: Self-pay | Admitting: Family Medicine

## 2014-07-06 ENCOUNTER — Ambulatory Visit (INDEPENDENT_AMBULATORY_CARE_PROVIDER_SITE_OTHER): Payer: Medicare Other | Admitting: Family Medicine

## 2014-07-06 VITALS — BP 120/78 | Ht 60.0 in | Wt 132.5 lb

## 2014-07-06 DIAGNOSIS — K219 Gastro-esophageal reflux disease without esophagitis: Secondary | ICD-10-CM

## 2014-07-06 DIAGNOSIS — E785 Hyperlipidemia, unspecified: Secondary | ICD-10-CM

## 2014-07-06 DIAGNOSIS — E782 Mixed hyperlipidemia: Secondary | ICD-10-CM

## 2014-07-06 MED ORDER — PANTOPRAZOLE SODIUM 40 MG PO TBEC: 40.0000 mg | DELAYED_RELEASE_TABLET | Freq: Every day | ORAL | Status: DC

## 2014-07-06 NOTE — Progress Notes (Signed)
   Subjective:    Patient ID: Rachael Matthews, female    DOB: 03-22-1946, 68 y.o.   MRN: [id number]  Hyperlipidemia This is a chronic problem. The current episode started more than 1 year ago. There are no known factors aggravating her hyperlipidemia. Pertinent negatives include no chest pain or shortness of breath. Treatments tried: cholesterol pack. The current treatment provides mild improvement of lipids. Compliance problems: yes, been off of cholesterol medicine for about 2 months.  There are no known risk factors for coronary artery disease.   Patient states that she is having issues with acid reflux she would like to talk with the doctor about.    Review of Systems  Constitutional: Negative for activity change, appetite change and fatigue.  HENT: Negative for congestion.   Respiratory: Negative for cough and shortness of breath.   Cardiovascular: Negative for chest pain and leg swelling.  Endocrine: Negative for polydipsia and polyphagia.  Genitourinary: Negative for frequency.  Neurological: Negative for weakness.  Psychiatric/Behavioral: Negative for confusion.       Objective:   Physical Exam  Vitals reviewed. Constitutional: She appears well-nourished. No distress.  Cardiovascular: Normal rate, regular rhythm and normal heart sounds.   No murmur heard. Pulmonary/Chest: Effort normal and breath sounds normal. No respiratory distress.  Musculoskeletal: She exhibits no edema.  Lymphadenopathy:    She has no cervical adenopathy.  Neurological: She is alert. She exhibits normal muscle tone.  Psychiatric: Her behavior is normal.          Assessment & Plan:  Hyperlipidemia-she does not tolerate statins. I am hopeful the new medication on the horizon will be a good choice for her currently that is not available.  She does have osteopenia in one area also slight osteoporosis this patient needs to have her bone density done again in a few months time I believe this patient  would benefit from IV Reclast she cannot tolerate oral Fosamax.

## 2014-11-01 ENCOUNTER — Telehealth: Payer: Self-pay | Admitting: *Deleted

## 2014-11-01 NOTE — Telephone Encounter (Signed)
Patient states that Dr. Nicki Reaper told her at her last visit to have bloodwork done and come in to see him first before repeating bone density. She is set up to have bloodwork done through the county and she will come in for her physical with Hoyle Sauer and discuss this further at that visit.

## 2014-11-01 NOTE — Telephone Encounter (Signed)
[facility] 11/5 Calling to see if she has done her bone density screen this year. She is in the [employer] for November 2015.

## 2014-11-06 ENCOUNTER — Telehealth: Payer: Self-pay | Admitting: *Deleted

## 2014-11-06 ENCOUNTER — Other Ambulatory Visit: Payer: Self-pay | Admitting: *Deleted

## 2014-11-06 DIAGNOSIS — M810 Age-related osteoporosis without current pathological fracture: Secondary | ICD-10-CM

## 2014-11-06 NOTE — Telephone Encounter (Signed)
Note in tickler file to scheduled bone density test and follow up office visit for November 2015. Bone density test scheduled at [facility] dec 1st. At 9 am. Pt notified. Pt states she will call us back to schedule follow up office visit.

## 2014-11-27 ENCOUNTER — Other Ambulatory Visit (HOSPITAL_COMMUNITY): Payer: Medicare Other

## 2014-12-03 ENCOUNTER — Ambulatory Visit (INDEPENDENT_AMBULATORY_CARE_PROVIDER_SITE_OTHER): Payer: Medicare Other | Admitting: Nurse Practitioner

## 2014-12-03 ENCOUNTER — Ambulatory Visit (HOSPITAL_COMMUNITY)
Admission: RE | Admit: 2014-12-03 | Discharge: 2014-12-03 | Disposition: A | Discharge status: Home / Self Care | Payer: Medicare Other | Source: Ambulatory Visit | Attending: Family Medicine | Admitting: Family Medicine

## 2014-12-03 ENCOUNTER — Encounter: Payer: Self-pay | Admitting: Nurse Practitioner

## 2014-12-03 VITALS — BP 116/78 | Ht 60.0 in | Wt 130.0 lb

## 2014-12-03 DIAGNOSIS — E785 Hyperlipidemia, unspecified: Secondary | ICD-10-CM

## 2014-12-03 DIAGNOSIS — Z780 Asymptomatic menopausal state: Secondary | ICD-10-CM | POA: Insufficient documentation

## 2014-12-03 DIAGNOSIS — M810 Age-related osteoporosis without current pathological fracture: Secondary | ICD-10-CM

## 2014-12-03 DIAGNOSIS — M8580 Other specified disorders of bone density and structure, unspecified site: Secondary | ICD-10-CM | POA: Insufficient documentation

## 2014-12-03 DIAGNOSIS — Z23 Encounter for immunization: Secondary | ICD-10-CM

## 2014-12-03 DIAGNOSIS — Z90711 Acquired absence of uterus with remaining cervical stump: Secondary | ICD-10-CM | POA: Insufficient documentation

## 2014-12-03 DIAGNOSIS — Z8781 Personal history of (healed) traumatic fracture: Secondary | ICD-10-CM | POA: Insufficient documentation

## 2014-12-03 DIAGNOSIS — K219 Gastro-esophageal reflux disease without esophagitis: Secondary | ICD-10-CM

## 2014-12-03 DIAGNOSIS — Z0000 Encounter for general adult medical examination without abnormal findings: Secondary | ICD-10-CM

## 2014-12-03 MED ORDER — RANITIDINE HCL 300 MG PO TABS: 300.0000 mg | ORAL_TABLET | Freq: Every day | ORAL | Status: DC

## 2014-12-03 NOTE — Progress Notes (Signed)
Subjective:    Patient ID: Rachael Matthews, female    DOB: April 23, 1946, 68 y.o.   MRN: [id number]  HPI AWV- Annual Wellness Visit  The patient was seen for their annual wellness visit. The patient's past medical history, surgical history, and family history were reviewed. Pertinent vaccines were reviewed ( tetanus, pneumonia, shingles, flu) The patient's medication list was reviewed and updated.  The height and weight were entered. The patient's current BMI is:25.39  Cognitive screening was completed. Outcome of Mini - Cog: pass  Falls within the past 6 months:none  Current tobacco usage: none (All patients who use tobacco were given written and verbal information on quitting)  Recent listing of emergency department/hospitalizations over the past year were reviewed.  current specialist the patient sees on a regular basis none   Medicare annual wellness visit patient questionnaire was reviewed.  A written screening schedule for the patient for the next 5-10 years was given. Appropriate discussion of followup regarding next visit was discussed.  Presents for wellness exam. Regular vision and dental exams. Same sexual partner. No pelvic pain. Has bone density study scheduled for later today. Has stopped Welchol; states she was told by provider here that it would not do enough for cholesterol. Has had significant reactions to all meds except for this one. Unsure if it was working; did not have labs at the time. Overall healthy diet. Limited activity. Occasional acid reflux. Tries to avoid using Protonix due to potential affect on bones. Not taking daily ASA.      Review of Systems  Constitutional: Negative for fever, activity change, appetite change and fatigue.  HENT: Negative for dental problem, ear pain, sinus pressure and sore throat.   Respiratory: Negative for cough, chest tightness, shortness of breath and wheezing.   Cardiovascular: Negative for chest pain.  Gastrointestinal:  Negative for nausea, vomiting, abdominal pain, diarrhea, constipation and abdominal distention.  Genitourinary: Negative for dysuria, urgency, frequency, vaginal bleeding, vaginal discharge, enuresis, difficulty urinating, genital sores and pelvic pain.       Objective:   Physical Exam  Constitutional: She is oriented to person, place, and time. She appears well-developed. No distress.  HENT:  Right Ear: External ear normal.  Left Ear: External ear normal.  Mouth/Throat: Oropharynx is clear and moist.  Neck: Normal range of motion. Neck supple. No tracheal deviation present. No thyromegaly present.  Cardiovascular: Normal rate, regular rhythm and normal heart sounds.  Exam reveals no gallop.   No murmur heard. Pulmonary/Chest: Effort normal and breath sounds normal.  Abdominal: Soft. She exhibits no distension. There is no tenderness.  Genitourinary: Vagina normal. No vaginal discharge found.  External GU and vagina: pale; no discharge. Bimanual exam: no tenderness or obvious masses; ovaries nonpalpable. Rectal exam: no masses; no stool for hemoccult.   Musculoskeletal: She exhibits no edema.  Lymphadenopathy:    She has no cervical adenopathy.  Neurological: She is alert and oriented to person, place, and time.  Skin: Skin is warm and dry. No rash noted.  Psychiatric: She has a normal mood and affect. Her behavior is normal.  Vitals reviewed. Breast exam: no masses; axillae: no adenopathy.         Assessment & Plan:   Problem List Items Addressed This Visit      Digestive   GERD (gastroesophageal reflux disease)     Musculoskeletal and Integument   Osteoporosis (Chronic)     Other   Hyperlipemia    Other Visit Diagnoses    Routine  general medical examination at a health care facility    -  Primary    Relevant Orders       POC Hemoccult Bld/Stl (3-Cd Home Screen)    Need for prophylactic vaccination and inoculation against influenza        Relevant Orders       Flu  Vaccine QUAD 36+ mos PF IM (Fluarix Quad PF) (Completed)    Need for prophylactic vaccination against Streptococcus pneumoniae (pneumococcus)        Relevant Orders       Pneumococcal conjugate vaccine 13-valent IM (Completed)      Lengthy discussion regarding lipids and risks. Agrees to try med such as Welchol; will review and send in Rx. Switch to Zantac, take Protonix only if breakthrough reflux. Wants to schedule her own mammogram. Restart daily low dose EC ASA.  Recommend walking program and daily vitamin D/calcium supplement.  Return in about 6 months (around 06/04/2015). Next PE in one year.

## 2014-12-04 NOTE — Progress Notes (Signed)
Patient notified and verbalized understanding of the test results. No further questions. Put in referral for Reclast.

## 2014-12-10 ENCOUNTER — Other Ambulatory Visit: Payer: Self-pay | Admitting: Nurse Practitioner

## 2014-12-10 MED ORDER — COLESTIPOL HCL 1 G PO TABS: ORAL_TABLET | ORAL | Status: DC

## 2014-12-10 NOTE — Progress Notes (Signed)
Patient notified

## 2014-12-10 NOTE — Progress Notes (Signed)
Cholesterol med prescribed

## 2014-12-28 HISTORY — PX: EYE SURGERY: SHX253

## 2015-01-02 LAB — HM MAMMOGRAPHY

## 2015-01-10 ENCOUNTER — Encounter (HOSPITAL_COMMUNITY)
Admission: RE | Admit: 2015-01-10 | Discharge: 2015-01-10 | Disposition: A | Discharge status: Home / Self Care | Payer: Medicare Other | Source: Ambulatory Visit | Attending: Family Medicine | Admitting: Family Medicine

## 2015-01-10 ENCOUNTER — Encounter: Payer: Self-pay | Admitting: Family Medicine

## 2015-01-10 ENCOUNTER — Encounter (HOSPITAL_COMMUNITY): Payer: Self-pay

## 2015-01-10 DIAGNOSIS — M810 Age-related osteoporosis without current pathological fracture: Secondary | ICD-10-CM | POA: Diagnosis present

## 2015-01-10 LAB — COMPREHENSIVE METABOLIC PANEL
ALK PHOS: 54 U/L (ref 39–117)
ALT: 16 U/L (ref 0–35)
ANION GAP: 8 (ref 5–15)
AST: 23 U/L (ref 0–37)
Albumin: 4.6 g/dL (ref 3.5–5.2)
BUN: 17 mg/dL (ref 6–23)
CALCIUM: 9.8 mg/dL (ref 8.4–10.5)
CO2: 27 mmol/L (ref 19–32)
Chloride: 101 mEq/L (ref 96–112)
Creatinine, Ser: 0.65 mg/dL (ref 0.50–1.10)
GFR calc Af Amer: >90 mL/min (ref >90)
GFR, EST NON AFRICAN AMERICAN: 89 mL/min — AB (ref >90)
Glucose, Bld: 81 mg/dL (ref 70–99)
Potassium: 3.8 mmol/L (ref 3.5–5.1)
Sodium: 136 mmol/L (ref 135–145)
Total Bilirubin: 0.5 mg/dL (ref 0.3–1.2)
Total Protein: 8 g/dL (ref 6.0–8.3)

## 2015-01-10 LAB — MAGNESIUM: MAGNESIUM: 2.3 mg/dL (ref 1.5–2.5)

## 2015-01-10 LAB — PHOSPHORUS: Phosphorus: 3.5 mg/dL (ref 2.3–4.6)

## 2015-01-10 MED ORDER — ZOLEDRONIC ACID 5 MG/100ML IV SOLN
INTRAVENOUS | Status: AC
  Filled 2015-01-10: qty 100

## 2015-01-10 MED ORDER — SODIUM CHLORIDE 0.9 % IV SOLN
Freq: Once | INTRAVENOUS | Status: AC
  Administered 2015-01-10: 250 mL via INTRAVENOUS

## 2015-01-10 MED ORDER — ZOLEDRONIC ACID 5 MG/100ML IV SOLN
5.0000 mg | Freq: Once | INTRAVENOUS | Status: AC
  Administered 2015-01-10: 5 mg via INTRAVENOUS

## 2015-01-10 NOTE — Progress Notes (Signed)
Results for NORELY, SCHLICK (MRN [id number]) as of 01/10/2015 14:52 Labs drawn prior to reclast infusion. Tolerated infusion well without symptoms.  Ref. Range 01/10/2015 13:05  Sodium Latest Range: 135-145 mmol/L 136  Potassium Latest Range: 3.5-5.1 mmol/L 3.8  Chloride Latest Range: 96-112 mEq/L 101  CO2 Latest Range: 19-32 mmol/L 27  BUN Latest Range: 6-23 mg/dL 17  Creatinine Latest Range: 0.50-1.10 mg/dL 0.65  Calcium Latest Range: 8.4-10.5 mg/dL 9.8  GFR calc non Af Amer Latest Range: >90 mL/min 89 (L)  GFR calc Af Amer Latest Range: >90 mL/min >90  Glucose Latest Range: 70-99 mg/dL 81  Anion gap Latest Range: 5-15  8  Phosphorus Latest Range: 2.3-4.6 mg/dL 3.5  Magnesium Latest Range: 1.5-2.5 mg/dL 2.3  Alkaline Phosphatase Latest Range: 39-117 U/L 54  Albumin Latest Range: 3.5-5.2 g/dL 4.6  AST Latest Range: 0-37 U/L 23  ALT Latest Range: 0-35 U/L 16  Total Protein Latest Range: 6.0-8.3 g/dL 8.0  Total Bilirubin Latest Range: 0.3-1.2 mg/dL 0.5

## 2015-01-10 NOTE — Discharge Instructions (Signed)

## 2015-01-16 ENCOUNTER — Encounter: Payer: Self-pay | Admitting: *Deleted

## 2015-08-12 ENCOUNTER — Encounter: Payer: Self-pay | Admitting: Family Medicine

## 2015-08-12 ENCOUNTER — Ambulatory Visit (INDEPENDENT_AMBULATORY_CARE_PROVIDER_SITE_OTHER): Payer: Medicare Other | Admitting: Family Medicine

## 2015-08-12 VITALS — BP 122/80 | Temp 98.5°F | Ht 60.0 in | Wt 136.0 lb

## 2015-08-12 DIAGNOSIS — G43009 Migraine without aura, not intractable, without status migrainosus: Secondary | ICD-10-CM

## 2015-08-12 DIAGNOSIS — G43909 Migraine, unspecified, not intractable, without status migrainosus: Secondary | ICD-10-CM | POA: Insufficient documentation

## 2015-08-12 MED ORDER — TOPIRAMATE 25 MG PO TABS: 25.0000 mg | ORAL_TABLET | Freq: Every day | ORAL | Status: DC

## 2015-08-12 MED ORDER — ALPRAZOLAM 0.25 MG PO TABS: ORAL_TABLET | ORAL | Status: DC

## 2015-08-12 NOTE — Progress Notes (Signed)
   Subjective:    Patient ID: Rachael Matthews, female    DOB: 04-Dec-1946, 69 y.o.   MRN: [id number]  HPIpt walking on treadmill last week. Pt started to fill dizzy and pulse on treadmill was 99. Pt states since then she felt bad and had dizzy spells off and on. Pt states she use to have to take antivert for vertigo. Pt states she is feeling better today.  Was on treadmill for 12 min when felt dizzy, hr in 90s Some migraines recently, several per week. During the day, no vomiting, moderate nausea Tried excederin migraine  Pt also having migraines. She started back on topamax that was prescribed in the past.Prescribed by Dr Evelena Leyden.  She started back on this med about 1 week before getting on treadmill. She thinks maybe this was side effects from taking topamax. 25mg  per day for 1 week then when had spell she stopped Had 2 migraines in the past 5 days, the migraines occurred during the day not at night some nausea no vomiting  Needs refill on xanax. Pt states she has one pill left and she filled that in 2013.   Review of Systems  Constitutional: Negative for activity change, appetite change and fatigue.  HENT: Negative for congestion.   Respiratory: Negative for cough.   Cardiovascular: Negative for chest pain.  Gastrointestinal: Negative for abdominal pain.  Endocrine: Negative for polydipsia and polyphagia.  Neurological: Negative for weakness.  Psychiatric/Behavioral: Negative for confusion.      no unilateral numbness/weakness Objective:   Physical Exam  Constitutional: She appears well-nourished. No distress.  Cardiovascular: Normal rate, regular rhythm and normal heart sounds.   No murmur heard. Pulmonary/Chest: Effort normal and breath sounds normal. No respiratory distress.  Musculoskeletal: She exhibits no edema.  Lymphadenopathy:    She has no cervical adenopathy.  Neurological: She is alert. She exhibits normal muscle tone.  Psychiatric: Her behavior is normal.  Vitals  reviewed.         Assessment & Plan:  I believe some of her problem is related into possibly getting restarted on Topamax I do not find evidence of a stroke. The patient does have a history of cerebral vascular disease her blood pressure is good her aspirin she relates she does not always take every single day she will need to do a better job of this, she does have a history of hyperlipidemia she states the nurse practitioner with her workplace checked this she will send [place] the results. The patient in the past did not tolerate statins.  I believe it is safe for her to go ahead and reinitiate exercise gradually work up to her usual level if progressive troubles she is to let us  Know  Anxiety issues patient under a lot of stress this could be playing a slight role in her issue Xanax when absolutely necessary cautioned drowsiness  Migraines she may do low-dose Topamax to see if this helps if not doing better over the next month follow-up otherwise recheck in 6 months

## 2015-10-29 ENCOUNTER — Ambulatory Visit (INDEPENDENT_AMBULATORY_CARE_PROVIDER_SITE_OTHER): Payer: Medicare Other | Admitting: Family Medicine

## 2015-10-29 ENCOUNTER — Encounter: Payer: Self-pay | Admitting: Family Medicine

## 2015-10-29 VITALS — BP 136/82 | Ht 60.0 in | Wt 132.0 lb

## 2015-10-29 DIAGNOSIS — Z23 Encounter for immunization: Secondary | ICD-10-CM | POA: Diagnosis not present

## 2015-10-29 DIAGNOSIS — G43009 Migraine without aura, not intractable, without status migrainosus: Secondary | ICD-10-CM

## 2015-10-29 DIAGNOSIS — E782 Mixed hyperlipidemia: Secondary | ICD-10-CM | POA: Diagnosis not present

## 2015-10-29 DIAGNOSIS — R197 Diarrhea, unspecified: Secondary | ICD-10-CM | POA: Diagnosis not present

## 2015-10-29 MED ORDER — TOPIRAMATE 25 MG PO TABS: 25.0000 mg | ORAL_TABLET | Freq: Two times a day (BID) | ORAL | Status: DC

## 2015-10-29 NOTE — Progress Notes (Addendum)
   Subjective:    Patient ID: Rachael Matthews, female    DOB: [birth year], 69 y.o.   MRN: [id number]  HPI Patient arrives with c/o spells of loose stools for years- worse last few months. Patient cant really connect it with food in particular. Patient denies mucousy denies blood in his tried associated with food and unsuccessful so far figuring out what's causing this In the past patient not been able to tolerate statins recently had lab work done would like to repeat her LDL actually looks much better than what but it still not where it needs to be. Patient does not meet criteria for a check troubles cholesterol medicine currently Patient states that migraines or not quite as bad as they have been what she would like to consider increasing medication to see things will do better. Review of Systems She denies fever chills sweats nausea vomiting she does relate diarrhea    Objective:   Physical Exam Lungs clear heart regular neck no masses extremities no edema abdomen salt BP good  Could be IBS could be celiac could be colitis 25 minutes was spent with the patient. Greater than half the time was spent in discussion and answering questions and counseling regarding the issues that the patient came in for today.     Assessment & Plan:  Diarrhea-check for some iliac disease also we'll get sedimentation rate these tests were normal consider evaluation of the gastroenterology patient will be due colonoscopy 2018  Migraines increase Topamax 25 twice a day  Hyperlipidemia actually doing better than what she has been but cannot tolerate statins

## 2015-10-31 LAB — CBC WITH DIFFERENTIAL/PLATELET
Basophils Absolute: 0.1 10*3/uL (ref 0.0–0.2)
Basos: 1 %
EOS (ABSOLUTE): 0.4 10*3/uL (ref 0.0–0.4)
Eos: 4 %
HEMOGLOBIN: 14.8 g/dL (ref 11.1–15.9)
Hematocrit: 43.6 % (ref 34.0–46.6)
IMMATURE GRANS (ABS): 0 10*3/uL (ref 0.0–0.1)
Immature Granulocytes: 0 %
LYMPHS: 32 %
Lymphocytes Absolute: 2.9 10*3/uL (ref 0.7–3.1)
MCH: 29.6 pg (ref 26.6–33.0)
MCHC: 33.9 g/dL (ref 31.5–35.7)
MCV: 87 fL (ref 79–97)
MONOCYTES: 9 %
Monocytes Absolute: 0.8 10*3/uL (ref 0.1–0.9)
NEUTROS ABS: 4.8 10*3/uL (ref 1.4–7.0)
NEUTROS PCT: 54 %
PLATELETS: 358 10*3/uL (ref 150–379)
RBC: 5 x10E6/uL (ref 3.77–5.28)
RDW: 14.4 % (ref 12.3–15.4)
WBC: 8.9 10*3/uL (ref 3.4–10.8)

## 2015-10-31 LAB — SEDIMENTATION RATE: SED RATE: 2 mm/h (ref 0–40)

## 2015-10-31 LAB — TISSUE TRANSGLUTAMINASE, IGA

## 2015-11-01 ENCOUNTER — Encounter: Payer: Self-pay | Admitting: Family Medicine

## 2015-11-01 NOTE — Addendum Note (Signed)
Addended by: Carmelina Noun on: 11/01/2015 08:43 AM   Modules accepted: Orders

## 2015-11-04 ENCOUNTER — Encounter: Payer: Self-pay | Admitting: Internal Medicine

## 2015-11-26 ENCOUNTER — Ambulatory Visit: Payer: Medicare Other | Admitting: Gastroenterology

## 2015-12-10 ENCOUNTER — Ambulatory Visit (INDEPENDENT_AMBULATORY_CARE_PROVIDER_SITE_OTHER): Payer: Medicare Other | Admitting: Gastroenterology

## 2015-12-10 ENCOUNTER — Encounter: Payer: Self-pay | Admitting: Gastroenterology

## 2015-12-10 ENCOUNTER — Other Ambulatory Visit: Payer: Self-pay

## 2015-12-10 ENCOUNTER — Telehealth: Payer: Self-pay | Admitting: Internal Medicine

## 2015-12-10 VITALS — BP 138/84 | HR 79 | Temp 98.1°F | Ht 60.0 in | Wt 129.8 lb

## 2015-12-10 DIAGNOSIS — R197 Diarrhea, unspecified: Secondary | ICD-10-CM

## 2015-12-10 DIAGNOSIS — K580 Irritable bowel syndrome with diarrhea: Secondary | ICD-10-CM | POA: Insufficient documentation

## 2015-12-10 MED ORDER — PEG 3350-KCL-NA BICARB-NACL 420 G PO SOLR: 4000.0000 mL | Freq: Once | ORAL | Status: DC

## 2015-12-10 NOTE — Telephone Encounter (Signed)
Patient was seen today and called back asking to speak with CJ. I told her that CJ had another patient with her and she would have to call her back. FO:[id number]

## 2015-12-10 NOTE — Patient Instructions (Signed)
1. Colonoscopy with Dr. Rourk. See separate instructions.  

## 2015-12-10 NOTE — Telephone Encounter (Signed)
Noted. Spoke with pt she has been changed from 01/03/2016-12/18/2015

## 2015-12-10 NOTE — Progress Notes (Signed)
Primary Care Physician:  Scott Luking, MD  Primary Gastroenterologist:  Michael Rourk, MD    Chief Complaint  Patient presents with  . Diarrhea    HPI:  Rachael Matthews is a 69 y.o. female here at the request of Dr. Luking for further evaluation of diarrhea and consideration of colonoscopy. Baseline intermittent diarrhea but worse for past 3 months. Symptoms daily. BM 7 per day. Wakes up at 2am. Urgency. Postprandial.doesn't want to leave her house. No melena, brbpr. Does not tolerate dairy. Uses lactase if eats cheese otherwise avoids. Dairy causes abdominal pain. No melena, brbpr. A lot of cramping. Weight down unintentional 7 pounds since 07/2015. No travel abroad before her symptoms began. Recent trip to [place]. No recent antibiotics or ill contacts.  Recent work up with negative celiac screen, normal ESR and H/H.   Low-fiber/low residue diet since November 1st. Stools more formed and less frequent. Very strict bland diet. removed most of vegetables.    Current Outpatient Prescriptions  Medication Sig Dispense Refill  . ALPRAZolam (XANAX) 0.25 MG tablet One bid prn 30 tablet 2  . Ibuprofen 200 MG CAPS Take by mouth as needed.      . topiramate (TOPAMAX) 25 MG tablet Take 1 tablet (25 mg total) by mouth 2 (two) times daily. 60 tablet 4   No current facility-administered medications for this visit.    Allergies as of 12/10/2015 - Review Complete 12/10/2015  Allergen Reaction Noted  . Codeine  04/28/2013  . Statins  04/28/2013  . Fosamax [alendronate sodium]  07/06/2014    Past Medical History  Diagnosis Date  . Mixed hyperlipidemia   . GERD (gastroesophageal reflux disease)   . Depression   . MRI of brain abnormal   . Stroke (HCC)   . Small vessel disease (HCC)   . Headache(784.0)   . Osteoporosis     Past Surgical History  Procedure Laterality Date  . Abdominal hysterectomy    . Bladder surgery    . Cervical spine surgery    . Mandible fracture surgery    .  Colonoscopy  06/09/2007    RMR:anal papillae otherwise normal    Family History  Problem Relation Age of Onset  . Coronary artery disease Father     Died with MI age 57  . Heart attack Father   . Hypertension Mother   . Heart failure Mother   . Hyperlipidemia Mother   . Osteoporosis Mother   . Hypertension Sister   . Heart disease Brother   . Hypertension Brother   . Colon cancer Neg Hx   . Other Mother     bowel issues    Social History   Social History  . Marital Status: Married    Spouse Name: N/A  . Number of Children: N/A  . Years of Education: N/A   Occupational History  . Retired     [place] [employer]   Social History Main Topics  . Smoking status: Never Smoker   . Smokeless tobacco: Never Used  . Alcohol Use: No  . Drug Use: No  . Sexual Activity: Yes    Birth Control/ Protection: Surgical   Other Topics Concern  . Not on file   Social History Narrative      ROS:  General: Negative for anorexia, fever, chills, fatigue, weakness. See hpi Eyes: Negative for vision changes.  ENT: Negative for hoarseness, difficulty swallowing , nasal congestion. CV: Negative for chest pain, angina, palpitations, dyspnea on exertion, peripheral edema.    Respiratory: Negative for dyspnea at rest, dyspnea on exertion, cough, sputum, wheezing.  GI: See history of present illness. GU:  Negative for dysuria, hematuria, urinary incontinence, urinary frequency, nocturnal urination.  MS: Negative for joint pain, low back pain.  Derm: Negative for rash or itching.  Neuro: Negative for weakness, abnormal sensation, seizure, frequent headaches, memory loss, confusion.  Psych: Negative for anxiety, depression, suicidal ideation, hallucinations.  Endo: Negative for unusual weight change.  Heme: Negative for bruising or bleeding. Allergy: Negative for rash or hives.    Physical Examination:  BP 138/84 mmHg  Pulse 79  Temp(Src) 98.1 F (36.7 C) (Oral)  Ht 5'  (1.524 m)  Wt 129 lb 12.8 oz (58.877 kg)  BMI 25.35 kg/m2   General: Well-nourished, well-developed in no acute distress.  Head: Normocephalic, atraumatic.   Eyes: Conjunctiva pink, no icterus. Mouth: Oropharyngeal mucosa moist and pink , no lesions erythema or exudate. Neck: Supple without thyromegaly, masses, or lymphadenopathy.  Lungs: Clear to auscultation bilaterally.  Heart: Regular rate and rhythm, no murmurs rubs or gallops.  Abdomen: Bowel sounds are normal, nontender, nondistended, no hepatosplenomegaly or masses, no abdominal bruits or    hernia , no rebound or guarding.   Rectal: not performed Extremities: No lower extremity edema. No clubbing or deformities.  Neuro: Alert and oriented x 4 , grossly normal neurologically.  Skin: Warm and dry, no rash or jaundice.   Psych: Alert and cooperative, normal mood and affect.  Labs:   Lab Results  Component Value Date   WBC 8.9 10/29/2015   HCT 43.6 10/29/2015   hemoglobin 14.8 Platelets 358,000 Tissue transglutaminase less than 2  Lab Results  Component Value Date   ESRSEDRATE 2 10/29/2015   Labs from October 2016 Total bilirubin 0.3, alkaline phosphatase 44, AST 15, ALT 11, albumin 3.9, BUN 15, creatinine 0.75  Imaging Studies: No results found.

## 2015-12-16 NOTE — Assessment & Plan Note (Signed)
69 y/o female with acute on chronic diarrhea for past 3 months. Nocturnal diarrhea. Remote colonoscopy in 2008. Celiac screen negative. Ddx includes microscopic colitis, IBD, IBS, less likely infectious etiology or malignancy. Recommend colonoscopy with random colon biopsies in the near future.  I have discussed the risks, alternatives, benefits with regards to but not limited to the risk of reaction to medication, bleeding, infection, perforation and the patient is agreeable to proceed. Written consent to be obtained.

## 2015-12-17 NOTE — Progress Notes (Signed)
cc'ed to pcp °

## 2015-12-18 ENCOUNTER — Encounter (HOSPITAL_COMMUNITY)
Admission: RE | Disposition: A | Discharge status: Home / Self Care | Payer: Self-pay | Source: Ambulatory Visit | Attending: Internal Medicine

## 2015-12-18 ENCOUNTER — Ambulatory Visit (HOSPITAL_COMMUNITY)
Admission: RE | Admit: 2015-12-18 | Discharge: 2015-12-18 | Disposition: A | Discharge status: Home / Self Care | Payer: Medicare Other | Source: Ambulatory Visit | Attending: Internal Medicine | Admitting: Internal Medicine

## 2015-12-18 ENCOUNTER — Encounter (HOSPITAL_COMMUNITY): Payer: Self-pay | Admitting: *Deleted

## 2015-12-18 DIAGNOSIS — Z79899 Other long term (current) drug therapy: Secondary | ICD-10-CM | POA: Diagnosis not present

## 2015-12-18 DIAGNOSIS — Z8673 Personal history of transient ischemic attack (TIA), and cerebral infarction without residual deficits: Secondary | ICD-10-CM | POA: Diagnosis not present

## 2015-12-18 DIAGNOSIS — R197 Diarrhea, unspecified: Secondary | ICD-10-CM | POA: Insufficient documentation

## 2015-12-18 DIAGNOSIS — K5730 Diverticulosis of large intestine without perforation or abscess without bleeding: Secondary | ICD-10-CM | POA: Diagnosis not present

## 2015-12-18 DIAGNOSIS — E782 Mixed hyperlipidemia: Secondary | ICD-10-CM | POA: Diagnosis not present

## 2015-12-18 DIAGNOSIS — K5732 Diverticulitis of large intestine without perforation or abscess without bleeding: Secondary | ICD-10-CM

## 2015-12-18 DIAGNOSIS — K219 Gastro-esophageal reflux disease without esophagitis: Secondary | ICD-10-CM | POA: Diagnosis not present

## 2015-12-18 DIAGNOSIS — F329 Major depressive disorder, single episode, unspecified: Secondary | ICD-10-CM | POA: Insufficient documentation

## 2015-12-18 DIAGNOSIS — K6389 Other specified diseases of intestine: Secondary | ICD-10-CM | POA: Diagnosis not present

## 2015-12-18 HISTORY — PX: COLONOSCOPY: SHX5424

## 2015-12-18 SURGERY — COLONOSCOPY
Anesthesia: Moderate Sedation

## 2015-12-18 MED ORDER — MEPERIDINE HCL 100 MG/ML IJ SOLN
INTRAMUSCULAR | Status: AC
  Filled 2015-12-18: qty 2

## 2015-12-18 MED ORDER — STERILE WATER FOR IRRIGATION IR SOLN
Status: DC | PRN
  Administered 2015-12-18: 13:00:00

## 2015-12-18 MED ORDER — ONDANSETRON HCL 4 MG/2ML IJ SOLN
INTRAMUSCULAR | Status: DC | PRN
  Administered 2015-12-18: 4 mg via INTRAVENOUS

## 2015-12-18 MED ORDER — SODIUM CHLORIDE 0.9 % IV SOLN
INTRAVENOUS | Status: DC
  Administered 2015-12-18: 1000 mL via INTRAVENOUS

## 2015-12-18 MED ORDER — MIDAZOLAM HCL 5 MG/5ML IJ SOLN
INTRAMUSCULAR | Status: DC | PRN
  Administered 2015-12-18: 1 mg via INTRAVENOUS
  Administered 2015-12-18: 2 mg via INTRAVENOUS
  Administered 2015-12-18: 1 mg via INTRAVENOUS

## 2015-12-18 MED ORDER — MIDAZOLAM HCL 5 MG/5ML IJ SOLN
INTRAMUSCULAR | Status: AC
  Filled 2015-12-18: qty 10

## 2015-12-18 MED ORDER — ONDANSETRON HCL 4 MG/2ML IJ SOLN
INTRAMUSCULAR | Status: AC
  Filled 2015-12-18: qty 2

## 2015-12-18 MED ORDER — MEPERIDINE HCL 100 MG/ML IJ SOLN
INTRAMUSCULAR | Status: DC | PRN
Start: 2015-12-18 — End: 2015-12-18
  Administered 2015-12-18: 25 mg via INTRAVENOUS
  Administered 2015-12-18: 50 mg via INTRAVENOUS

## 2015-12-18 NOTE — Interval H&P Note (Signed)
History and Physical Interval Note:  12/18/2015 1:01 PM  Rachael Matthews  has presented today for surgery, with the diagnosis of diarrhea  The various methods of treatment have been discussed with the patient and family. After consideration of risks, benefits and other options for treatment, the patient has consented to  Procedure(s) with comments: COLONOSCOPY (N/A) - 0730 - moved to 12/21 @ 1:00 - office notified pt as a surgical intervention .  The patient's history has been reviewed, patient examined, no change in status, stable for surgery.  I have reviewed the patient's chart and labs.  Questions were answered to the patient's satisfaction.     Rachael Matthews No change. Colonoscopy per plan.The risks, benefits, limitations, alternatives and imponderables have been reviewed with the patient. Questions have been answered. All parties are agreeable.

## 2015-12-18 NOTE — Interval H&P Note (Signed)
History and Physical Interval Note:  12/18/2015 1:01 PM  Rachael Matthews  has presented today for surgery, with the diagnosis of diarrhea  The various methods of treatment have been discussed with the patient and family. After consideration of risks, benefits and other options for treatment, the patient has consented to  Procedure(s) with comments: COLONOSCOPY (N/A) - 0730 - moved to 12/21 @ 1:00 - office notified pt as a surgical intervention .  The patient's history has been reviewed, patient examined, no change in status, stable for surgery.  I have reviewed the patient's chart and labs.  Questions were answered to the patient's satisfaction.     Stacye Noori  No change. Diagnostic colonoscopy per plan.mr

## 2015-12-18 NOTE — Discharge Instructions (Signed)
Colonoscopy Discharge Instructions  Read the instructions outlined below and refer to this sheet in the next few weeks. These discharge instructions provide you with general information on caring for yourself after you leave the hospital. Your doctor may also give you specific instructions. While your treatment has been planned according to the most current medical practices available, unavoidable complications occasionally occur. If you have any problems or questions after discharge, call Dr. Gala Romney at [phone]. ACTIVITY  You may resume your regular activity, but move at a slower pace for the next 24 hours.   Take frequent rest periods for the next 24 hours.   Walking will help get rid of the air and reduce the bloated feeling in your belly (abdomen).   No driving for 24 hours (because of the medicine (anesthesia) used during the test).    Do not sign any important legal documents or operate any machinery for 24 hours (because of the anesthesia used during the test).  NUTRITION  Drink plenty of fluids.   You may resume your normal diet as instructed by your doctor.   Begin with a light meal and progress to your normal diet. Heavy or fried foods are harder to digest and may make you feel sick to your stomach (nauseated).   Avoid alcoholic beverages for 24 hours or as instructed.  MEDICATIONS  You may resume your normal medications unless your doctor tells you otherwise.  WHAT YOU CAN EXPECT TODAY  Some feelings of bloating in the abdomen.   Passage of more gas than usual.   Spotting of blood in your stool or on the toilet paper.  IF YOU HAD POLYPS REMOVED DURING THE COLONOSCOPY:  No aspirin products for 7 days or as instructed.   No alcohol for 7 days or as instructed.   Eat a soft diet for the next 24 hours.  FINDING OUT THE RESULTS OF YOUR TEST Not all test results are available during your visit. If your test results are not back during the visit, make an appointment  with your caregiver to find out the results. Do not assume everything is normal if you have not heard from your caregiver or the medical facility. It is important for you to follow up on all of your test results.  SEEK IMMEDIATE MEDICAL ATTENTION IF:  You have more than a spotting of blood in your stool.   Your belly is swollen (abdominal distention).   You are nauseated or vomiting.   You have a temperature over 101.   You have abdominal pain or discomfort that is severe or gets worse throughout the day.    Diverticulosis information provided  Further recommendations to follow pending review of laboratory reports  Diverticulosis Diverticulosis is the condition that develops when small pouches (diverticula) form in the wall of your colon. Your colon, or large intestine, is where water is absorbed and stool is formed. The pouches form when the inside layer of your colon pushes through weak spots in the outer layers of your colon. CAUSES  No one knows exactly what causes diverticulosis. RISK FACTORS  Being older than 26. Your risk for this condition increases with age. Diverticulosis is rare in people younger than 40 years. By age 47, almost everyone has it.  Eating a low-fiber diet.  Being frequently constipated.  Being overweight.  Not getting enough exercise.  Smoking.  Taking over-the-counter pain medicines, like aspirin and ibuprofen. SYMPTOMS  Most people with diverticulosis do not have symptoms. DIAGNOSIS  Because diverticulosis often  has no symptoms, health care providers often discover the condition during an exam for other colon problems. In many cases, a health care provider will diagnose diverticulosis while using a flexible scope to examine the colon (colonoscopy). TREATMENT  If you have never developed an infection related to diverticulosis, you may not need treatment. If you have had an infection before, treatment may include:  Eating more fruits, vegetables,  and grains.  Taking a fiber supplement.  Taking a live bacteria supplement (probiotic).  Taking medicine to relax your colon. HOME CARE INSTRUCTIONS   Drink at least 6-8 glasses of water each day to prevent constipation.  Try not to strain when you have a bowel movement.  Keep all follow-up appointments. If you have had an infection before:  Increase the fiber in your diet as directed by your health care provider or dietitian.  Take a dietary fiber supplement if your health care provider approves.  Only take medicines as directed by your health care provider. SEEK MEDICAL CARE IF:   You have abdominal pain.  You have bloating.  You have cramps.  You have not gone to the bathroom in 3 days. SEEK IMMEDIATE MEDICAL CARE IF:   Your pain gets worse.  Yourbloating becomes very bad.  You have a fever or chills, and your symptoms suddenly get worse.  You begin vomiting.  You have bowel movements that are bloody or black. MAKE SURE YOU:  Understand these instructions.  Will watch your condition.  Will get help right away if you are not doing well or get worse.   This information is not intended to replace advice given to you by your health care provider. Make sure you discuss any questions you have with your health care provider.   Document Released: 09/10/2004 Document Revised: 12/19/2013 Document Reviewed: 11/08/2013 Elsevier Interactive Patient Education [employer].

## 2015-12-18 NOTE — H&P (View-Only) (Signed)
cc'ed to pcp °

## 2015-12-18 NOTE — H&P (View-Only) (Signed)
Primary Care Physician:  Sallee Lange, MD  Primary Gastroenterologist:  Garfield Cornea, MD    Chief Complaint  Patient presents with  . Diarrhea    HPI:  Rachael Matthews is a 69 y.o. female here at the request of Dr. Wolfgang Phoenix for further evaluation of diarrhea and consideration of colonoscopy. Baseline intermittent diarrhea but worse for past 3 months. Symptoms daily. BM 7 per day. Wakes up at 2am. Urgency. Postprandial.doesn't want to leave her house. No melena, brbpr. Does not tolerate dairy. Uses lactase if eats cheese otherwise avoids. Dairy causes abdominal pain. No melena, brbpr. A lot of cramping. Weight down unintentional 7 pounds since 07/2015. No travel abroad before her symptoms began. Recent trip to [place]. No recent antibiotics or ill contacts.  Recent work up with negative celiac screen, normal ESR and H/H.   Low-fiber/low residue diet since November 1st. Stools more formed and less frequent. Very strict bland diet. removed most of vegetables.    Current Outpatient Prescriptions  Medication Sig Dispense Refill  . ALPRAZolam (XANAX) 0.25 MG tablet One bid prn 30 tablet 2  . Ibuprofen 200 MG CAPS Take by mouth as needed.      . topiramate (TOPAMAX) 25 MG tablet Take 1 tablet (25 mg total) by mouth 2 (two) times daily. 60 tablet 4   No current facility-administered medications for this visit.    Allergies as of 12/10/2015 - Review Complete 12/10/2015  Allergen Reaction Noted  . Codeine  04/28/2013  . Statins  04/28/2013  . Fosamax [alendronate sodium]  07/06/2014    Past Medical History  Diagnosis Date  . Mixed hyperlipidemia   . GERD (gastroesophageal reflux disease)   . Depression   . MRI of brain abnormal   . Stroke ([place])   . Small vessel disease ([place])   . Headache(784.0)   . Osteoporosis     Past Surgical History  Procedure Laterality Date  . Abdominal hysterectomy    . Bladder surgery    . Cervical spine surgery    . Mandible fracture surgery    .  Colonoscopy  06/09/2007    [id number] papillae otherwise normal    Family History  Problem Relation Age of Onset  . Coronary artery disease Father     Died with MI age 5  . Heart attack Father   . Hypertension Mother   . Heart failure Mother   . Hyperlipidemia Mother   . Osteoporosis Mother   . Hypertension Sister   . Heart disease Brother   . Hypertension Brother   . Colon cancer Neg Hx   . Other Mother     bowel issues    Social History   Social History  . Marital Status: Married    Spouse Name: N/A  . Number of Children: N/A  . Years of Education: N/A   Occupational History  . Retired     [employer]   Social History Main Topics  . Smoking status: Never Smoker   . Smokeless tobacco: Never Used  . Alcohol Use: No  . Drug Use: No  . Sexual Activity: Yes    Birth Control/ Protection: Surgical   Other Topics Concern  . Not on file   Social History Narrative      ROS:  General: Negative for anorexia, fever, chills, fatigue, weakness. See hpi Eyes: Negative for vision changes.  ENT: Negative for hoarseness, difficulty swallowing , nasal congestion. CV: Negative for chest pain, angina, palpitations, dyspnea on exertion, peripheral edema.  Respiratory: Negative for dyspnea at rest, dyspnea on exertion, cough, sputum, wheezing.  GI: See history of present illness. GU:  Negative for dysuria, hematuria, urinary incontinence, urinary frequency, nocturnal urination.  MS: Negative for joint pain, low back pain.  Derm: Negative for rash or itching.  Neuro: Negative for weakness, abnormal sensation, seizure, frequent headaches, memory loss, confusion.  Psych: Negative for anxiety, depression, suicidal ideation, hallucinations.  Endo: Negative for unusual weight change.  Heme: Negative for bruising or bleeding. Allergy: Negative for rash or hives.    Physical Examination:  BP 138/84 mmHg  Pulse 79  Temp(Src) 98.1 F (36.7 C) (Oral)  Ht 5'  (1.524 m)  Wt 129 lb 12.8 oz (58.877 kg)  BMI 25.35 kg/m2   General: Well-nourished, well-developed in no acute distress.  Head: Normocephalic, atraumatic.   Eyes: Conjunctiva pink, no icterus. Mouth: Oropharyngeal mucosa moist and pink , no lesions erythema or exudate. Neck: Supple without thyromegaly, masses, or lymphadenopathy.  Lungs: Clear to auscultation bilaterally.  Heart: Regular rate and rhythm, no murmurs rubs or gallops.  Abdomen: Bowel sounds are normal, nontender, nondistended, no hepatosplenomegaly or masses, no abdominal bruits or    hernia , no rebound or guarding.   Rectal: not performed Extremities: No lower extremity edema. No clubbing or deformities.  Neuro: Alert and oriented x 4 , grossly normal neurologically.  Skin: Warm and dry, no rash or jaundice.   Psych: Alert and cooperative, normal mood and affect.  Labs:   Lab Results  Component Value Date   WBC 8.9 10/29/2015   HCT 43.6 10/29/2015   hemoglobin 14.8 Platelets 358,000 Tissue transglutaminase less than 2  Lab Results  Component Value Date   ESRSEDRATE 2 10/29/2015   Labs from October 2016 Total bilirubin 0.3, alkaline phosphatase 44, AST 15, ALT 11, albumin 3.9, BUN 15, creatinine 0.75  Imaging Studies: No results found.

## 2015-12-18 NOTE — Op Note (Signed)
[facility] [address] [place], [address]   COLONOSCOPY PROCEDURE REPORT  PATIENT: Rachael Matthews, Rachael Matthews  MR#: [id number] BIRTHDATE: 05/07/1946 , 69  yrs. old GENDER: female ENDOSCOPIST: R.  Garfield Cornea, MD FACP [facility] REFERRED [id number] Wolfgang Phoenix, M.D. PROCEDURE DATE:  2015/12/24 PROCEDURE:   Colonoscopy with biopsy INDICATIONS:chronic diarrhea. MEDICATIONS: Versed 4 mg IV and Demerol 75 mg IV in divided doses. Zofran 4 mg IV. ASA CLASS:       Class II  CONSENT: The risks, benefits, alternatives and imponderables including but not limited to bleeding, perforation as well as the possibility of a missed lesion have been reviewed.  The potential for biopsy, lesion removal, etc. have also been discussed. Questions have been answered.  All parties agreeable.  Please see the history and physical in the medical record for more information.  DESCRIPTION OF PROCEDURE:   After the risks benefits and alternatives of the procedure were thoroughly explained, informed consent was obtained.  The digital rectal exam revealed no abnormalities of the rectum.   The [id number]  endoscope was introduced through the anus and advanced to the terminal ileum which was intubated for a short distance. No adverse events experienced.   The quality of the prep was adequate  The instrument was then slowly withdrawn as the colon was fully examined. Estimated blood loss is zero unless otherwise noted in this procedure report.      COLON FINDINGS: Normal-appearing rectal mucosa.  Scattered left-sided diverticula; the remainder the colonic mucosa appeared normal aside from mild diffuse pigmentation consistent with melanosis coli.  The distal 10 cm of terminal ileal mucosa also appeared normal.  A stool sample was submitted for GI pathogen panel.  Also, segmental biopsies of the right and left colon taken for histologic study. Retroflexion was performed. .  Withdrawal time=8 minutes  0 seconds.  The scope was withdrawn and the procedure completed. COMPLICATIONS: There were no immediate complications.  ENDOSCOPIC IMPRESSION: Colonic diverticulosis. Status post biopsy and stool sampling. Melanosis coli.  RECOMMENDATIONS: Follow-up on pending studies  eSigned:  R. Garfield Cornea, MD Rosalita Chessman [facility] 2015/12/24 1:31 PM   cc:  CPT CODES: ICD CODES:  The ICD and CPT codes recommended by this software are interpretations from the data that the clinical staff has captured with the software.  The verification of the translation of this report to the ICD and CPT codes and modifiers is the sole responsibility of the health care institution and practicing physician where this report was generated.  [place]. will not be held responsible for the validity of the ICD and CPT codes included on this report.  AMA assumes no liability for data contained or not contained herein. CPT is a Designer, television/film set of the [employer].  PATIENT NAME:  [name], [name] MR#: [id number]

## 2015-12-19 LAB — GASTROINTESTINAL PANEL BY PCR, STOOL (REPLACES STOOL CULTURE)
Adenovirus F40/41: NOT DETECTED
Astrovirus: NOT DETECTED
CYCLOSPORA CAYETANENSIS: NOT DETECTED
Campylobacter species: NOT DETECTED
Cryptosporidium: NOT DETECTED
E. COLI O157: NOT DETECTED
ENTAMOEBA HISTOLYTICA: NOT DETECTED
ENTEROAGGREGATIVE E COLI (EAEC): NOT DETECTED
Enteropathogenic E coli (EPEC): NOT DETECTED
Enterotoxigenic E coli (ETEC): NOT DETECTED
GIARDIA LAMBLIA: NOT DETECTED
NOROVIRUS GI/GII: NOT DETECTED
Plesimonas shigelloides: NOT DETECTED
Rotavirus A: NOT DETECTED
SALMONELLA SPECIES: NOT DETECTED
SAPOVIRUS (I, II, IV, AND V): NOT DETECTED
SHIGELLA/ENTEROINVASIVE E COLI (EIEC): NOT DETECTED
Shiga like toxin producing E coli (STEC): NOT DETECTED
VIBRIO CHOLERAE: NOT DETECTED
VIBRIO SPECIES: NOT DETECTED
YERSINIA ENTEROCOLITICA: NOT DETECTED

## 2015-12-26 ENCOUNTER — Encounter (HOSPITAL_COMMUNITY): Payer: Self-pay | Admitting: Internal Medicine

## 2016-01-07 ENCOUNTER — Telehealth: Payer: Self-pay | Admitting: Internal Medicine

## 2016-01-07 ENCOUNTER — Other Ambulatory Visit: Payer: Self-pay | Admitting: Internal Medicine

## 2016-01-07 ENCOUNTER — Other Ambulatory Visit: Payer: Self-pay

## 2016-01-07 DIAGNOSIS — R197 Diarrhea, unspecified: Secondary | ICD-10-CM | POA: Diagnosis not present

## 2016-01-07 NOTE — Telephone Encounter (Signed)
I have been informed of the C. Difficile toxin assay is no longer included on a GI pathogen panel. I was waiting for this result. Biopsies from the colon demonstrated no evidence of microscopic colitis. To cover all basis, she needs a Clostridium difficile toxin assay on a stool sample. Once that comes back we'll make further recommendations. If all negative, she needs to back to the office to reassess her symptoms.  Sorry for the delay.  Please let her know.

## 2016-01-07 NOTE — Telephone Encounter (Signed)
[phone]  PATIENT STILL HAS NOT HEARD HER TCS RESULTS, PLEASE CALL

## 2016-01-07 NOTE — Telephone Encounter (Signed)
Pt is aware. She will come by the office and pick up the stool container for the cdiff. Orders done and the container is at the front desk. Pt was very understanding.

## 2016-01-07 NOTE — Telephone Encounter (Signed)
Routing to RMR. 

## 2016-01-09 LAB — CLOSTRIDIUM DIFFICILE BY PCR: Toxigenic C. Difficile by PCR: NOT DETECTED

## 2016-01-14 ENCOUNTER — Other Ambulatory Visit (HOSPITAL_COMMUNITY): Payer: Medicare Other

## 2016-01-14 ENCOUNTER — Encounter (HOSPITAL_COMMUNITY): Payer: Medicare Other

## 2016-01-27 DIAGNOSIS — Z1231 Encounter for screening mammogram for malignant neoplasm of breast: Secondary | ICD-10-CM | POA: Diagnosis not present

## 2016-02-10 ENCOUNTER — Telehealth: Payer: Self-pay | Admitting: Family Medicine

## 2016-02-10 ENCOUNTER — Encounter: Payer: Self-pay | Admitting: Family Medicine

## 2016-02-10 NOTE — Telephone Encounter (Signed)
Patient was notified that Dr. Nicki Reaper recommends for a lipid, liver, metabolic 7 and vitamin D be drawn. Patient stated that she will let [facility] know what to draw.

## 2016-02-10 NOTE — Telephone Encounter (Signed)
Pt is needing to know what labs Dr. Nicki Reaper will want for her next visit. Pt has her labs drawn at the health dept and they have drawn to vials but need to know what labs he wants done.

## 2016-02-10 NOTE — Telephone Encounter (Signed)
Lipid, liver, metabolic 7, vitamin D 

## 2016-02-12 ENCOUNTER — Encounter: Payer: Self-pay | Admitting: Family Medicine

## 2016-02-12 ENCOUNTER — Ambulatory Visit (INDEPENDENT_AMBULATORY_CARE_PROVIDER_SITE_OTHER): Payer: Medicare HMO | Admitting: Family Medicine

## 2016-02-12 VITALS — BP 138/80 | Ht 60.0 in | Wt 130.1 lb

## 2016-02-12 DIAGNOSIS — M25512 Pain in left shoulder: Secondary | ICD-10-CM

## 2016-02-12 DIAGNOSIS — M810 Age-related osteoporosis without current pathological fracture: Secondary | ICD-10-CM | POA: Diagnosis not present

## 2016-02-12 DIAGNOSIS — E782 Mixed hyperlipidemia: Secondary | ICD-10-CM | POA: Diagnosis not present

## 2016-02-12 DIAGNOSIS — J309 Allergic rhinitis, unspecified: Secondary | ICD-10-CM

## 2016-02-12 DIAGNOSIS — R197 Diarrhea, unspecified: Secondary | ICD-10-CM

## 2016-02-12 MED ORDER — RANITIDINE HCL 150 MG PO TABS: 150.0000 mg | ORAL_TABLET | Freq: Every day | ORAL | Status: DC

## 2016-02-12 MED ORDER — FLUTICASONE PROPIONATE 50 MCG/ACT NA SUSP: 2.0000 | Freq: Every day | NASAL | Status: DC

## 2016-02-12 NOTE — Progress Notes (Signed)
   Subjective:    Patient ID: Rachael Matthews, female    DOB: 03-25-1946, 70 y.o.   MRN: [id number]  Hyperlipidemia This is a chronic problem. The current episode started more than 1 year ago.  Diarrhea  This is a recurrent problem. The current episode started more than 1 month ago. The problem has been gradually improving.   patient had colonoscopy completed did not show any tumor but yet she still has a diarrhea gastroenterology did not discuss with her the diarrhea issue. Patient has concerns of allergies. Patient using Allegra tablet seems to help but still having postnasal drip and some cough Patient delayed getting her osteoporosis treatment because of the stressful situation in the family see below PMH benign Review of Systems  Gastrointestinal: Positive for diarrhea.   patient denies chest tightness pressure pain shortness breath nausea vomiting     Objective:   Physical Exam Subjected discomfort left shoulder Lungs clear heart regular Extremities no edema Vital signs reviewed  25 minutes was spent with the patient. Greater than half the time was spent in discussion and answering questions and counseling regarding the issues that the patient came in for today.      Assessment & Plan:  Diarrhea issues somewhat better. Had a colonoscopy did not show any growths still having diarrhea I encouraged her to follow-up with gastroenterology for them to look into this further could be IBS with diarrhea  Hyperlipidemia cannot tolerate statins patient getting lab work done we are awaiting the results of this may need referral to lipid clinic with cardiology and they could try the knee were injected below medicine. Addendum-lab work shows LDL 214 HDL 42 total 324-patient cannot tolerate statins-she has known history of cerebral vascular disease and is at high risk of coronary artery disease. We will look into Praluent  I encouraged patient to get her osteoporosis injection of Reclast she  states that she will be doing so after things get better she apparently having some trouble with a friend who has a brain abscess  Moderate allergy issues taking Allegra currently I recommend using Flonase in addition to this  Left shoulder pain discomfort hurts with certain movements over the past few months referral to orthopedics gentle exercises recommended

## 2016-02-13 ENCOUNTER — Encounter: Payer: Self-pay | Admitting: Family Medicine

## 2016-02-20 ENCOUNTER — Encounter: Payer: Self-pay | Admitting: Family Medicine

## 2016-02-28 DIAGNOSIS — M25512 Pain in left shoulder: Secondary | ICD-10-CM | POA: Diagnosis not present

## 2016-03-03 ENCOUNTER — Ambulatory Visit (INDEPENDENT_AMBULATORY_CARE_PROVIDER_SITE_OTHER): Payer: Medicare HMO | Admitting: Internal Medicine

## 2016-03-03 ENCOUNTER — Encounter: Payer: Self-pay | Admitting: Internal Medicine

## 2016-03-03 VITALS — BP 144/74 | HR 59 | Temp 97.8°F | Ht 60.0 in | Wt 127.8 lb

## 2016-03-03 DIAGNOSIS — R197 Diarrhea, unspecified: Secondary | ICD-10-CM | POA: Diagnosis not present

## 2016-03-03 DIAGNOSIS — K219 Gastro-esophageal reflux disease without esophagitis: Secondary | ICD-10-CM

## 2016-03-03 DIAGNOSIS — K589 Irritable bowel syndrome without diarrhea: Secondary | ICD-10-CM | POA: Diagnosis not present

## 2016-03-03 MED ORDER — HYOSCYAMINE SULFATE 0.125 MG SL SUBL: 0.1250 mg | SUBLINGUAL_TABLET | SUBLINGUAL | Status: DC | PRN

## 2016-03-03 NOTE — Progress Notes (Signed)
Primary Care Physician:  Sallee Lange, MD Primary Gastroenterologist:  Dr. Gala Romney  Pre-Procedure History & Physical: HPI:  Rachael Matthews is a 70 y.o. female here for follow-up of intermittent diarrhea.  Last bout of diarrhea was over February 9 10th and 11th. Since that time she's had normal bowel function wound is one formed bowel movement daily. She has not had any bleeding. She says she knows certain foods such as shrimp scampi and mixed vegetable group "set her off". A look abdominal cramping or vomiting. She's been extensively evaluated celiac screen negative biopsies for: Negative for microscopic colitis. No evidence of inflammatory bowel disease. A GI pathogen panel and C. Difficile toxin assay has come back negative. Weight has been stable. She has a stressful job part-time as a Administrator, [employer]. She sleeps poorly at night sometimes having to take lorazepam. She's not on any specific agent to control diarrhea. Based on her some stool diary recall, she doesn't have diarrhea most of the time however when she gets it she feels it is nearly incapacitating.  Past Medical History  Diagnosis Date  . Mixed hyperlipidemia   . GERD (gastroesophageal reflux disease)   . Depression   . MRI of brain abnormal   . Stroke ([place])   . Small vessel disease ([place])   . Headache(784.0)   . Osteoporosis   . Diverticulosis     Past Surgical History  Procedure Laterality Date  . Abdominal hysterectomy    . Bladder surgery    . Cervical spine surgery    . Mandible fracture surgery    . Colonoscopy  06/09/2007    [id number] papillae otherwise normal  . Colonoscopy N/A 12/18/2015    Dr.Sadia Belfiore- colonic diverticulosis, melanosis coli, bx= benign, stool sample= negative    Prior to Admission medications   Medication Sig Start Date End Date Taking? Authorizing Provider  ALPRAZolam Duanne Moron) 0.25 MG tablet One bid prn 08/12/15  Yes Kathyrn Drown, MD  aspirin 81 MG tablet Take 81 mg by mouth daily.    Yes Historical Provider, MD  fluticasone (FLONASE) 50 MCG/ACT nasal spray Place 2 sprays into both nostrils daily. 02/12/16  Yes Kathyrn Drown, MD  Ibuprofen 200 MG CAPS Take 400 mg by mouth every 6 (six) hours as needed (pain).    Yes Historical Provider, MD  ranitidine (ZANTAC) 150 MG tablet Take 1 tablet (150 mg total) by mouth at bedtime. 02/12/16  Yes Kathyrn Drown, MD  topiramate (TOPAMAX) 25 MG tablet Take 1 tablet (25 mg total) by mouth 2 (two) times daily. 10/29/15  Yes Kathyrn Drown, MD    Allergies as of 03/03/2016 - Review Complete 03/03/2016  Allergen Reaction Noted  . Codeine  04/28/2013  . Statins  04/28/2013  . Fosamax [alendronate sodium]  07/06/2014    Family History  Problem Relation Age of Onset  . Coronary artery disease Father     Died with MI age 52  . Heart attack Father   . Hypertension Mother   . Heart failure Mother   . Hyperlipidemia Mother   . Osteoporosis Mother   . Hypertension Sister   . Heart disease Brother   . Hypertension Brother   . Colon cancer Neg Hx   . Other Mother     bowel issues    Social History   Social History  . Marital Status: Married    Spouse Name: N/A  . Number of Children: N/A  . Years of Education: N/A  Occupational History  . Retired     [employer]   Social History Main Topics  . Smoking status: Never Smoker   . Smokeless tobacco: Never Used  . Alcohol Use: No  . Drug Use: No  . Sexual Activity: Yes    Birth Control/ Protection: Surgical   Other Topics Concern  . Not on file   Social History Narrative    Review of Systems: See HPI, otherwise negative ROS  Physical Exam: BP 144/74 mmHg  Pulse 59  Temp(Src) 97.8 F (36.6 C) (Oral)  Ht 5' (1.524 m)  Wt 127 lb 12.8 oz (57.97 kg)  BMI 24.96 kg/m2 General:   Alert,  Well-developed, well-nourished, pleasant and cooperative in NAD Abdomen: Non-distended, normal bowel sounds.  Soft and nontender without appreciable mass or  hepatosplenomegaly.   Impression:  Pleasant 70 year old lady who has an extensive evaluation for intermittent diarrhea. Given the negative workup thus far and the intermittent nature of her symptoms, she most likely has diarrhea predominant irritable bowel syndrome. I discussed this  benign but chronic entity with her at length. I feel she would do pretty well with a pharmacological agent to use on demand rather than take something everyday.  GERD symptoms well controlled on when necessary H2 blockade   Recommendations:   Information on Irritable Bowel Syndrome provided  Will Try Levsin SL tablets 0.125 mg - one under the tongue before meals and at bedtime as needed for diarrhea  Will dispense 60 with 3 refills  Patient to keep a stool diary  Continue ranitidine when necessary  Office visit in 2 months    Notice: This dictation was prepared with Dragon dictation along with smaller phrase technology. Any transcriptional errors that result from this process are unintentional and may not be corrected upon review.

## 2016-03-03 NOTE — Patient Instructions (Signed)
Information on Irritable Bowel Syndrome  Try Levsin SL tablets 0.125 mg - one under the tongue before meals and at bedtime as needed for diarrhea  We will dispense 60 with 3 refills  Keep a stool diary  Office visit in 2 months

## 2016-03-11 DIAGNOSIS — M25512 Pain in left shoulder: Secondary | ICD-10-CM | POA: Diagnosis not present

## 2016-03-11 DIAGNOSIS — M7542 Impingement syndrome of left shoulder: Secondary | ICD-10-CM | POA: Diagnosis not present

## 2016-03-11 DIAGNOSIS — M25612 Stiffness of left shoulder, not elsewhere classified: Secondary | ICD-10-CM | POA: Diagnosis not present

## 2016-03-11 DIAGNOSIS — M6281 Muscle weakness (generalized): Secondary | ICD-10-CM | POA: Diagnosis not present

## 2016-03-31 ENCOUNTER — Telehealth: Payer: Self-pay | Admitting: Family Medicine

## 2016-03-31 DIAGNOSIS — E785 Hyperlipidemia, unspecified: Secondary | ICD-10-CM

## 2016-03-31 NOTE — Telephone Encounter (Signed)
Please communicate with the patient. Let her know that her recent blood work from February and was sent to [place] which showed her bad cholesterol significantly elevated at 214 and her total cholesterol 324. Please let her be aware that the cardiology office with [facility] health care in [place] has a lipid clinic which can help her get started on your cholesterol medicines that are injectable that are not statins. Given her vascular disease with stroke risk it would be a good idea for her to consider seeing them. We can refer her if she is interested. The consultation would be covered by Medicare and in many situations the clinic can help get medication approved to help treat the cholesterol. Please discuss with patient, document, let me know.

## 2016-04-01 ENCOUNTER — Telehealth: Payer: Self-pay | Admitting: Family Medicine

## 2016-04-01 NOTE — Telephone Encounter (Signed)
Spoke with patient and informed her per Dr.Scott Luking-This appears to be a diagnosis that was put in outside of our office area it was put in over 3 years ago.Epic EMR can have information that is put in by medical personnel both at the hospital here and in [place] as well as many different specialists as well as [employer] and [place] [facility]. I am not sure why this diagnosis is on her problem list. We did not put it there. I've researched her record I do not see evidence of a stroke. I do see below that on her MRI she has significant small vessel disease probably related to her cholesterol issue. That is why it is so important to get her cholesterol under control to lessen the risk of stroke. Patient verbalized understanding. Referral to Lipid clinic in [place].

## 2016-04-01 NOTE — Telephone Encounter (Signed)
[facility] 04/01/16 

## 2016-04-01 NOTE — Telephone Encounter (Signed)
Spoke with patient and informed her per Dr.Scott Luking- Dr.Scott  has marked those issues regarding stroke as resolved but the electronic record does not give me the power to remove it. That is because someone else put it in. We will have our office manager talk with information technology to see if this can be removed. Patient verbalized understanding

## 2016-04-01 NOTE — Telephone Encounter (Signed)
Please let the patient know that I have marked those issues regarding stroke as resolved but the electronic record does not give me the power to remove it. That is because someone else put it in. We will have our office manager talk with information technology to see if this can be removed.

## 2016-04-01 NOTE — Telephone Encounter (Signed)
Spoke with patient and informed her per Dr.Scott Luking-  Recent blood work from February was sent to [place] which showed her bad cholesterol significantly elevated at 214 and her total cholesterol 324.  The cardiology office with [facility] health care in [place] has a lipid clinic which can help her get started on your cholesterol medicines that are injectable that are not statins. Given her vascular disease with stroke risk it would be a good idea for her to consider seeing them. We can refer her if she is interested. The consultation would be covered by Medicare and in many situations the clinic can help get medication approved to help treat the cholesterol. Patient verbalized understanding and stated that she would like to go to the lipid clinic. Also patient stated that she saw on her mychart where it indicated that she has a stroke in the past and was unaware of this diagnoses and would like to discuss why this diagnoses is listed in her chart. Please advise?

## 2016-04-01 NOTE — Addendum Note (Signed)
Addended by: Launa Grill on: 04/01/2016 09:57 AM   Modules accepted: Orders

## 2016-04-01 NOTE — Telephone Encounter (Signed)
I will need her paper chart please

## 2016-04-01 NOTE — Telephone Encounter (Signed)
This appears to be a diagnosis that was put in outside of our office area it was put in over 3 years ago.Epic EMR can have information that is put in by medical personnel both at the hospital here and in [place] as well as many different specialists as well as [employer] and [place] and [facility]. I am not sure why this diagnosis is on her problem list. We did not put it there. I've researched her record I do not see evidence of a stroke. I do see below that on her MRI she has significant small vessel disease probably related to her cholesterol issue. That is why it is so important to get her cholesterol under control to lessen the risk of stroke. Please make referral to cardiology in [facility] for the purpose of referral from the cardiologist to their lipid clinic. Let the patient know that I have electronically remove the diagnosis of stroke from the problem list in her medical record

## 2016-04-01 NOTE — Telephone Encounter (Signed)
Paper chart at nurses station

## 2016-04-02 ENCOUNTER — Encounter: Payer: Self-pay | Admitting: Family Medicine

## 2016-04-03 ENCOUNTER — Other Ambulatory Visit: Payer: Self-pay | Admitting: Family Medicine

## 2016-04-03 NOTE — Telephone Encounter (Signed)
May have this same 3 refills

## 2016-04-07 ENCOUNTER — Encounter: Payer: Self-pay | Admitting: Family Medicine

## 2016-04-14 ENCOUNTER — Encounter: Payer: Self-pay | Admitting: Internal Medicine

## 2016-04-27 ENCOUNTER — Encounter: Payer: Self-pay | Admitting: Cardiology

## 2016-04-27 ENCOUNTER — Ambulatory Visit (INDEPENDENT_AMBULATORY_CARE_PROVIDER_SITE_OTHER): Payer: Medicare HMO | Admitting: Cardiology

## 2016-04-27 VITALS — BP 124/82 | HR 71 | Ht 60.0 in | Wt 131.8 lb

## 2016-04-27 DIAGNOSIS — E782 Mixed hyperlipidemia: Secondary | ICD-10-CM | POA: Diagnosis not present

## 2016-04-27 DIAGNOSIS — Z8249 Family history of ischemic heart disease and other diseases of the circulatory system: Secondary | ICD-10-CM | POA: Diagnosis not present

## 2016-04-27 HISTORY — DX: Family history of ischemic heart disease and other diseases of the circulatory system: Z82.49

## 2016-04-27 NOTE — Progress Notes (Signed)
Cardiology Office Note    Date:  04/27/2016   ID:  Rachael Matthews, DOB 1946-06-15, MRN [id number]  PCP:  Sallee Lange, MD  Cardiologist:  Sueanne Margarita, MD   Chief Complaint  Patient presents with  . Hyperlipidemia    History of Present Illness:  Rachael Matthews is a 70 y.o. female who presents today for evaluation of dyslipidemia.  She has had elevated cholesterol since 1980's.  Her last lipids showed an total cholesterol of 324, LDL 214, HDL 42 and TAG 255.  She is currently not on any lipid lowering agents.  She tried Pravachol which she tolerated well but it did not control her cholesterol well and her insurance then would not cover it and she was switched to Lipitor.  She did not Lipitor due to severe memory issues which resulted in her not being able to function in her job and she had to retire. She had similar problems with Zocor,Crestor and Zetia.  She was on Welchol but she did not have any response in cholesterol lowering.    She denies any chest pain, SOB, DOE, LE edema, palpitations or syncope.  She denies any claudications symptoms. She has a very strong family history of early CAD, dyslipidemia and cerebral small vessel disease.      Past Medical History  Diagnosis Date  . Mixed hyperlipidemia   . GERD (gastroesophageal reflux disease)   . Depression   . MRI of brain abnormal   . Small vessel disease ([place])   . Headache(784.0)   . Osteoporosis   . Diverticulosis   . Family history of premature coronary artery disease 04/27/2016    Past Surgical History  Procedure Laterality Date  . Abdominal hysterectomy    . Bladder surgery    . Cervical spine surgery    . Mandible fracture surgery    . Colonoscopy  06/09/2007    [id number] papillae otherwise normal  . Colonoscopy N/A 12/18/2015    Dr.Rourk- colonic diverticulosis, melanosis coli, bx= benign, stool sample= negative    Current Medications: Outpatient Prescriptions Prior to Visit  Medication Sig Dispense Refill    . aspirin 81 MG tablet Take 81 mg by mouth daily.    . fluticasone (FLONASE) 50 MCG/ACT nasal spray Place 2 sprays into both nostrils daily. 16 g 6  . Ibuprofen 200 MG CAPS Take 400 mg by mouth every 6 (six) hours as needed (pain).     . ranitidine (ZANTAC) 150 MG tablet Take 1 tablet (150 mg total) by mouth at bedtime. 90 tablet 3  . topiramate (TOPAMAX) 25 MG tablet Take 1 tablet (25 mg total) by mouth 2 (two) times daily. 60 tablet 4  . ALPRAZolam (XANAX) 0.25 MG tablet TAKE 1 TABLET BY MOUTH TWICE DAILY AS NEEDED 30 tablet 3  . hyoscyamine (LEVSIN/SL) 0.125 MG SL tablet Place 1 tablet (0.125 mg total) under the tongue every 4 (four) hours as needed. 60 tablet 3   No facility-administered medications prior to visit.     Allergies:   Codeine; Statins; and Fosamax   Social History   Social History  . Marital Status: Married    Spouse Name: N/A  . Number of Children: N/A  . Years of Education: N/A   Occupational History  . Retired     [employer]   Social History Main Topics  . Smoking status: Never Smoker   . Smokeless tobacco: Never Used  . Alcohol Use: No  . Drug Use: No  .  Sexual Activity: Yes    Birth Control/ Protection: Surgical   Other Topics Concern  . None   Social History Narrative     Family History:  The patient's family history includes Coronary artery disease in her father; Heart attack in her father; Heart disease in her brother; Heart failure in her mother; Hyperlipidemia in her mother; Hypertension in her brother, mother, and sister; Osteoporosis in her mother; Other in her mother. There is no history of Colon cancer.   ROS:   Please see the history of present illness.    ROS All other systems reviewed and are negative.   PHYSICAL EXAM:   VS:  BP 124/82 mmHg  Pulse 71  Ht 5' (1.524 m)  Wt 131 lb 12.8 oz (59.784 kg)  BMI 25.74 kg/m2   GEN: Well nourished, well developed, in no acute distress HEENT: normal Neck: no JVD,  carotid bruits, or masses Cardiac: RRR; no murmurs, rubs, or gallops,no edema.  Intact distal pulses bilaterally.  Respiratory:  clear to auscultation bilaterally, normal work of breathing GI: soft, nontender, nondistended, + BS MS: no deformity or atrophy Skin: warm and dry, no rash Neuro:  Alert and Oriented x 3, Strength and sensation are intact Psych: euthymic mood, full affect  Wt Readings from Last 3 Encounters:  04/27/16 131 lb 12.8 oz (59.784 kg)  03/03/16 127 lb 12.8 oz (57.97 kg)  02/12/16 130 lb 2 oz (59.024 kg)      Studies/Labs Reviewed:   EKG:  EKG is  ordered today.  The ekg ordered today demonstrates NSR at 71bpm with no ST changes  Recent Labs: 10/29/2015: Platelets 358   Lipid Panel No results found for: CHOL, TRIG, HDL, CHOLHDL, VLDL, LDLCALC, LDLDIRECT  Additional studies/ records that were reviewed today include:  Office visit notes from PCP and labs    ASSESSMENT:    1. Mixed hyperlipidemia   2. Family history of early CAD      PLAN:  In order of problems listed above:  1. Hyperlipidemia - this is most likely familiial given the severity.  She is intolerant to all statins and has had severe memory loss with them requiring her to quit her job.  Welchol did not lower her lipids and she also had memory loss with Zetia.  I have recommended that we refer her to our lipid clinic to see if she would qualify for the PCSK 9 inhibitor based on the fact that she has had a head scan showing findings consistent with small vessel disease./ 2. Family history of premature CAD - given her marked elevation in lipids and family history of CAD - I will set her up for a nuclear stress test as a baseline to rule out ischemia.   She will followup with me in 1 year if her study is normal    Medication Adjustments/Labs and Tests Ordered: Current medicines are reviewed at length with the patient today.  Concerns regarding medicines are outlined above.  Medication changes,  Labs and Tests ordered today are listed in the Patient Instructions below.   [name], MD  04/27/2016 1:49 PM    [facility] [address], [place], [place]  [address] Phone: [phone]; Fax: [phone]

## 2016-04-27 NOTE — Patient Instructions (Signed)
Medication Instructions:  Your physician recommends that you continue on your current medications as directed. Please refer to the Current Medication list given to you today.   Labwork: None  Testing/Procedures: Dr. Radford Pax recommends you have a NUCLEAR STRESS TEST.  Follow-Up: Your physician recommends that you schedule a follow-up appointment WITH SALLY in the [facility].  Your physician wants you to follow-up in: 1 year with Dr. Radford Pax. You will receive a reminder letter in the mail two months in advance. If you don't receive a letter, please call our office to schedule the follow-up appointment.   Any Other Special Instructions Will Be Listed Below (If Applicable).     If you need a refill on your cardiac medications before your next appointment, please call your pharmacy.

## 2016-04-30 ENCOUNTER — Telehealth (HOSPITAL_COMMUNITY): Payer: Self-pay | Admitting: *Deleted

## 2016-04-30 NOTE — Telephone Encounter (Signed)
Left message on voicemail in reference to upcoming appointment scheduled for 05/06/16 Phone number given for a call back so details instructions can be given. Hubbard Robinson, RN

## 2016-05-04 ENCOUNTER — Telehealth (HOSPITAL_COMMUNITY): Payer: Self-pay | Admitting: *Deleted

## 2016-05-04 NOTE — Telephone Encounter (Signed)
Left message on voicemail in reference to upcoming appointment scheduled for 05/06/16. Phone number given for a call back so details instructions can be given. Andjela Wickes, Ranae Palms

## 2016-05-04 NOTE — Telephone Encounter (Signed)
Patient given detailed instructions per Myocardial Perfusion Study Information Sheet for the test on 05/06/16 at 0745. Patient notified to arrive 15 minutes early and that it is imperative to arrive on time for appointment to keep from having the test rescheduled.  If you need to cancel or reschedule your appointment, please call the office within 24 hours of your appointment. Failure to do so may result in a cancellation of your appointment, and a $50 no show fee. Patient verbalized understanding.Rachael Matthews

## 2016-05-06 ENCOUNTER — Ambulatory Visit (HOSPITAL_COMMUNITY): Payer: Medicare HMO | Attending: Internal Medicine

## 2016-05-06 ENCOUNTER — Ambulatory Visit (INDEPENDENT_AMBULATORY_CARE_PROVIDER_SITE_OTHER): Payer: Medicare HMO | Admitting: Pharmacist

## 2016-05-06 DIAGNOSIS — I10 Essential (primary) hypertension: Secondary | ICD-10-CM | POA: Insufficient documentation

## 2016-05-06 DIAGNOSIS — E782 Mixed hyperlipidemia: Secondary | ICD-10-CM | POA: Diagnosis not present

## 2016-05-06 DIAGNOSIS — Z8249 Family history of ischemic heart disease and other diseases of the circulatory system: Secondary | ICD-10-CM | POA: Diagnosis not present

## 2016-05-06 LAB — MYOCARDIAL PERFUSION IMAGING
CHL CUP MPHR: 149 {beats}/min
CHL CUP NUCLEAR SDS: 0
CHL CUP NUCLEAR SRS: 1
CHL CUP RESTING HR STRESS: 68 {beats}/min
CSEPED: 7 min
CSEPEDS: 15 s
CSEPEW: 8.9 METS
LV sys vol: 14 mL
LVDIAVOL: 41 mL (ref 46–106)
Peak HR: 142 {beats}/min
Percent HR: 95 %
RATE: 0.31
SSS: 1
TID: 0.97

## 2016-05-06 MED ORDER — EVOLOCUMAB 140 MG/ML ~~LOC~~ SOAJ: 1.0000 "pen " | SUBCUTANEOUS | Status: DC

## 2016-05-06 MED ORDER — OMEGA-3-ACID ETHYL ESTERS 1 G PO CAPS: 1.0000 g | ORAL_CAPSULE | Freq: Two times a day (BID) | ORAL | Status: DC

## 2016-05-06 MED ORDER — TECHNETIUM TC 99M SESTAMIBI GENERIC - CARDIOLITE
32.4000 | Freq: Once | INTRAVENOUS | Status: AC | PRN
  Administered 2016-05-06: 32 via INTRAVENOUS

## 2016-05-06 MED ORDER — TECHNETIUM TC 99M SESTAMIBI GENERIC - CARDIOLITE
10.2000 | Freq: Once | INTRAVENOUS | Status: AC | PRN
  Administered 2016-05-06: 10 via INTRAVENOUS

## 2016-05-06 NOTE — Patient Instructions (Addendum)
It as nice to see you today in clinic.  We will start the paperwork for Repatha injections to help lower your cholesterol. We will be in touch when we hear back from your insurance company.  Slowly try to reintroduce fish oil back in - we would like you to take 2 grams daily. This will help to lower your triglycerides.  If you have any questions about the medicine, please call Megan in the lipid clinic [phone].

## 2016-05-06 NOTE — Progress Notes (Signed)
Patient ID: ZURISADAI DONABEDIAN                 DOB: 02/17/1946                    MRN: [id number]     HPI: Rachael Matthews is a 70 y.o. female patient referred to lipid clinic by Dr. Radford Pax. PMH is significant for cerebral small vessel disease, HLD, and memory loss. Patient has tried multiple statins in the past. She was first started on Pravachol more than 5 years ago. This was switched to Lipitor which pt reports caused memory loss that resulted in her needing to retire early at age 18. She reports that she was seen by neurology during that time who agreed that statin was a likely cause of her memory issues. Her memory improved some upon discontinuation of statin but she now has baseline memory issues. Since then, she has been rechallenged with Zetia, Zocor, and Crestor, all of which caused worsening of her memory. She also tried colestipol and cholestyramine which she stopped due to lack of efficacy. She used to take fish oil but stopped while she was in the process of being diagnosed with IBD because the fish oil exacerbated her symptoms. She would like to restart this now.  Current Medications: None Intolerances: Atorvastatin, rosuvastatin, simvastatin, ezetimibe, colestipol, cholestyramine Risk Factors: cerebral small vessel disease, LDL >190, family history of premature CAD LDL goal: LDL <70 mg/dL  Family History: CAD/MI in her father; CAD in her brother; Heart failure in her mother; Hyperlipidemia in her mother; Hypertension in her brother, mother, and sister; Osteoporosis in her mother.    Social History: Never smoker. Denies smokeless tobacco, EtOH, and illicit drug use  Lipid Panel 02/10/16: TC 324, HDL 42 and TG 255, LDL-D 214 (on no therapy)  Past Medical History  Diagnosis Date  . Mixed hyperlipidemia   . GERD (gastroesophageal reflux disease)   . Depression   . MRI of brain abnormal   . Small vessel disease ([place])   . Headache(784.0)   . Osteoporosis   . Diverticulosis   . Family  history of premature coronary artery disease 04/27/2016    Current Outpatient Prescriptions on File Prior to Visit  Medication Sig Dispense Refill  . ALPRAZolam (XANAX) 0.25 MG tablet Take 0.25 mg by mouth 2 (two) times daily as needed for anxiety.    Marland Kitchen aspirin 81 MG tablet Take 81 mg by mouth daily.    . fluticasone (FLONASE) 50 MCG/ACT nasal spray Place 2 sprays into both nostrils daily. 16 g 6  . hyoscyamine (LEVSIN SL) 0.125 MG SL tablet Place 0.125 mg under the tongue every 4 (four) hours as needed for cramping.    . Ibuprofen 200 MG CAPS Take 400 mg by mouth every 6 (six) hours as needed (pain).     . ranitidine (ZANTAC) 150 MG tablet Take 1 tablet (150 mg total) by mouth at bedtime. 90 tablet 3  . topiramate (TOPAMAX) 25 MG tablet Take 1 tablet (25 mg total) by mouth 2 (two) times daily. 60 tablet 4   No current facility-administered medications on file prior to visit.    Allergies  Allergen Reactions  . Codeine     jittery  . Statins     "Cant remember how to do anything"  . Fosamax [Alendronate Sodium]     esophagitis    Assessment/Plan:  1.) Hyperlipidemia: Current LDL of 214 above goal of <70 mg/dL given history of cerebral  small vessel disease. Not currently on lipid lowering therapy due to memory problems with atorvastatin, rosuvastatin, simvastatin, and ezetimibe. Discussed initiation of PCSK9 inhibitor vs rechallenge with pravastatin. She does not want to restart a statin given her worsening memory which occurred with 3 statins, including rosuvastatin 5mg  daily. PCSK9i is her only other option to bring LDL to goal, discussed that copay will likely be expensive since she has Medicare Part D. Reviewed injection technique, expected benefits and side effect profile of Repatha injections. Will begin insurance process for Repatha coverage to determine if affordable option for pt. Will also initiate fish oil 2g daily to lower TG.    Repatha PA approved by pt insurance. Sent to  [employer]. Will follow up with pt when copay information available.  Stephens November, PharmD Clinical Pharmacy Resident 2:24 PM, 05/06/2016

## 2016-05-13 ENCOUNTER — Telehealth: Payer: Self-pay | Admitting: Pharmacist

## 2016-05-13 NOTE — Telephone Encounter (Signed)
Repatha approved for $50/month copay. Pt will call when she receives first shipment so that she can come in to clinic for first injection. Will set up lab work at that time.

## 2016-05-22 ENCOUNTER — Telehealth: Payer: Self-pay | Admitting: Pharmacist

## 2016-05-22 DIAGNOSIS — E785 Hyperlipidemia, unspecified: Secondary | ICD-10-CM

## 2016-05-22 NOTE — Telephone Encounter (Signed)
Patient came into clinic for first Repatha injection. Pt self-administered into her right outer thigh with no issues. Labwork scheduled for 6 weeks.

## 2016-07-01 ENCOUNTER — Encounter (INDEPENDENT_AMBULATORY_CARE_PROVIDER_SITE_OTHER): Payer: Self-pay

## 2016-07-01 ENCOUNTER — Other Ambulatory Visit (INDEPENDENT_AMBULATORY_CARE_PROVIDER_SITE_OTHER): Payer: Medicare HMO | Admitting: *Deleted

## 2016-07-01 DIAGNOSIS — E785 Hyperlipidemia, unspecified: Secondary | ICD-10-CM

## 2016-07-01 LAB — HEPATIC FUNCTION PANEL
ALT: 17 U/L (ref 6–29)
AST: 25 U/L (ref 10–35)
Albumin: 3.8 g/dL (ref 3.6–5.1)
Alkaline Phosphatase: 42 U/L (ref 33–130)
Bilirubin, Direct: 0.1 mg/dL (ref <=0.2)
Indirect Bilirubin: 0.2 mg/dL (ref 0.2–1.2)
TOTAL PROTEIN: 6.4 g/dL (ref 6.1–8.1)
Total Bilirubin: 0.3 mg/dL (ref 0.2–1.2)

## 2016-07-01 LAB — LIPID PANEL
CHOL/HDL RATIO: 2.6 ratio (ref <=5.0)
CHOLESTEROL: 130 mg/dL (ref 125–200)
HDL: 50 mg/dL (ref >=46)
LDL Cholesterol: 56 mg/dL (ref <130)
TRIGLYCERIDES: 121 mg/dL (ref <150)
VLDL: 24 mg/dL (ref <30)

## 2016-07-02 ENCOUNTER — Telehealth: Payer: Self-pay | Admitting: Cardiology

## 2016-07-02 NOTE — Telephone Encounter (Signed)
-----   Message from Sueanne Margarita, MD sent at 07/01/2016  9:52 PM EDT ----- Lipids at goal continue current therapy

## 2016-07-02 NOTE — Telephone Encounter (Signed)
Informed patient of results and verbal understanding expressed.  

## 2016-07-02 NOTE — Telephone Encounter (Signed)
F/u Message ° °pt returning RN call. Please call back to discuss °

## 2016-07-22 ENCOUNTER — Telehealth: Payer: Self-pay | Admitting: Family Medicine

## 2016-07-22 ENCOUNTER — Telehealth: Payer: Self-pay | Admitting: Pharmacist

## 2016-07-22 DIAGNOSIS — Z139 Encounter for screening, unspecified: Secondary | ICD-10-CM

## 2016-07-22 NOTE — Telephone Encounter (Signed)
Pt would like to have bw orders done please for an appt 8/21   She can no longer get her labs [place] through the county and will need them done via her PCP from now on  Call when ready

## 2016-07-22 NOTE — Telephone Encounter (Signed)
Pt called to report she received notification from her insurance company that she has hit the donut hole and unable to afford her [place].  Her copay increased to $400.  She would like to stay on therapy so she will call us in January to discuss restarting and being better prepared for when she is in the donut hole.

## 2016-07-22 NOTE — Telephone Encounter (Signed)
Metabolic 7, CBC-please let the patient know that I was able to see her cholesterol profile that was done by the lipid clinic therefore she does not need to do a cholesterol or liver function thank you

## 2016-07-23 NOTE — Telephone Encounter (Signed)
Spoke with patient and informed her per Dr.Steve Luking- we are ordering a BMET, and CBC. Dr.Scott was able to review the cholesterol profile that was done by the lipid clinic therefore you do not need to do a cholesterol or liver function. Patient verbalized understanding and labs were ordered.

## 2016-08-03 DIAGNOSIS — Z139 Encounter for screening, unspecified: Secondary | ICD-10-CM | POA: Diagnosis not present

## 2016-08-04 LAB — BASIC METABOLIC PANEL
BUN/Creatinine Ratio: 19 (ref 12–28)
BUN: 14 mg/dL (ref 8–27)
CALCIUM: 9.9 mg/dL (ref 8.7–10.3)
CHLORIDE: 105 mmol/L (ref 96–106)
CO2: 25 mmol/L (ref 18–29)
Creatinine, Ser: 0.73 mg/dL (ref 0.57–1.00)
GFR calc non Af Amer: 83 mL/min/{1.73_m2} (ref >59)
GFR, EST AFRICAN AMERICAN: 96 mL/min/{1.73_m2} (ref >59)
Glucose: 80 mg/dL (ref 65–99)
POTASSIUM: 4.8 mmol/L (ref 3.5–5.2)
Sodium: 143 mmol/L (ref 134–144)

## 2016-08-04 LAB — CBC WITH DIFFERENTIAL/PLATELET
BASOS ABS: 0.1 10*3/uL (ref 0.0–0.2)
Basos: 2 %
EOS (ABSOLUTE): 0.4 10*3/uL (ref 0.0–0.4)
Eos: 6 %
HEMOGLOBIN: 14.3 g/dL (ref 11.1–15.9)
Hematocrit: 42.1 % (ref 34.0–46.6)
IMMATURE GRANS (ABS): 0 10*3/uL (ref 0.0–0.1)
IMMATURE GRANULOCYTES: 0 %
LYMPHS: 43 %
Lymphocytes Absolute: 3 10*3/uL (ref 0.7–3.1)
MCH: 29.5 pg (ref 26.6–33.0)
MCHC: 34 g/dL (ref 31.5–35.7)
MCV: 87 fL (ref 79–97)
MONOCYTES: 9 %
Monocytes Absolute: 0.6 10*3/uL (ref 0.1–0.9)
NEUTROS ABS: 2.9 10*3/uL (ref 1.4–7.0)
NEUTROS PCT: 40 %
PLATELETS: 321 10*3/uL (ref 150–379)
RBC: 4.85 x10E6/uL (ref 3.77–5.28)
RDW: 14.1 % (ref 12.3–15.4)
WBC: 7.1 10*3/uL (ref 3.4–10.8)

## 2016-08-17 ENCOUNTER — Ambulatory Visit (INDEPENDENT_AMBULATORY_CARE_PROVIDER_SITE_OTHER): Payer: Medicare HMO | Admitting: Family Medicine

## 2016-08-17 ENCOUNTER — Encounter: Payer: Self-pay | Admitting: Family Medicine

## 2016-08-17 VITALS — BP 118/78 | Ht 60.0 in | Wt 129.2 lb

## 2016-08-17 DIAGNOSIS — M810 Age-related osteoporosis without current pathological fracture: Secondary | ICD-10-CM

## 2016-08-17 DIAGNOSIS — E782 Mixed hyperlipidemia: Secondary | ICD-10-CM

## 2016-08-17 NOTE — Progress Notes (Signed)
   Subjective:    Patient ID: Rachael Matthews, female    DOB: 09-06-1946, 70 y.o.   MRN: [id number]  Hyperlipidemia  This is a chronic problem. The current episode started more than 1 year ago. Pertinent negatives include no chest pain. Current antihyperlipidemic treatment includes diet change. The current treatment provides moderate improvement of lipids. There are no compliance problems.  There are no known risk factors for coronary artery disease.   Patient cannot afford the cholestrol medication currently prescribed.  Patient denies any chest tightness pressure pain shortness of breath patient frustrated by insurance and the whole concept of doughnut hole   Review of Systems  Constitutional: Negative for activity change, appetite change and fatigue.  HENT: Negative for congestion.   Respiratory: Negative for cough.   Cardiovascular: Negative for chest pain.  Gastrointestinal: Negative for abdominal pain.  Endocrine: Negative for polydipsia and polyphagia.  Neurological: Negative for weakness.  Psychiatric/Behavioral: Negative for confusion.       Objective:   Physical Exam  Constitutional: She appears well-nourished. No distress.  Cardiovascular: Normal rate, regular rhythm and normal heart sounds.   No murmur heard. Pulmonary/Chest: Effort normal and breath sounds normal. No respiratory distress.  Musculoskeletal: She exhibits no edema.  Lymphadenopathy:    She has no cervical adenopathy.  Neurological: She is alert. She exhibits normal muscle tone.  Psychiatric: Her behavior is normal.  Vitals reviewed.         Assessment & Plan:  Osteoporosis bone density ordered patient so far has not been able to afford treatment  Hyperlipidemia patient cannot afford the new treatment. Await the patient will reinitiate testing and treatment in January with cardiology once her insurance renews  Follow-up 6 months

## 2016-09-14 DIAGNOSIS — D239 Other benign neoplasm of skin, unspecified: Secondary | ICD-10-CM | POA: Diagnosis not present

## 2016-09-14 DIAGNOSIS — L280 Lichen simplex chronicus: Secondary | ICD-10-CM | POA: Diagnosis not present

## 2016-09-14 DIAGNOSIS — L570 Actinic keratosis: Secondary | ICD-10-CM | POA: Diagnosis not present

## 2016-09-15 DIAGNOSIS — Z961 Presence of intraocular lens: Secondary | ICD-10-CM | POA: Diagnosis not present

## 2016-09-28 ENCOUNTER — Other Ambulatory Visit: Payer: Self-pay | Admitting: Family Medicine

## 2016-12-07 ENCOUNTER — Other Ambulatory Visit: Payer: Self-pay | Admitting: Family Medicine

## 2016-12-08 NOTE — Telephone Encounter (Signed)
May have this +5 refills 

## 2016-12-10 ENCOUNTER — Encounter: Payer: Medicare HMO | Admitting: Nurse Practitioner

## 2016-12-23 ENCOUNTER — Telehealth: Payer: Self-pay | Admitting: *Deleted

## 2016-12-23 ENCOUNTER — Ambulatory Visit (INDEPENDENT_AMBULATORY_CARE_PROVIDER_SITE_OTHER): Payer: Medicare HMO | Admitting: Nurse Practitioner

## 2016-12-23 ENCOUNTER — Encounter: Payer: Self-pay | Admitting: Nurse Practitioner

## 2016-12-23 VITALS — BP 110/72 | Ht 60.75 in | Wt 131.8 lb

## 2016-12-23 DIAGNOSIS — J069 Acute upper respiratory infection, unspecified: Secondary | ICD-10-CM | POA: Diagnosis not present

## 2016-12-23 DIAGNOSIS — Z01419 Encounter for gynecological examination (general) (routine) without abnormal findings: Secondary | ICD-10-CM

## 2016-12-23 DIAGNOSIS — B9689 Other specified bacterial agents as the cause of diseases classified elsewhere: Secondary | ICD-10-CM

## 2016-12-23 DIAGNOSIS — E782 Mixed hyperlipidemia: Secondary | ICD-10-CM | POA: Diagnosis not present

## 2016-12-23 DIAGNOSIS — Z23 Encounter for immunization: Secondary | ICD-10-CM

## 2016-12-23 DIAGNOSIS — M810 Age-related osteoporosis without current pathological fracture: Secondary | ICD-10-CM

## 2016-12-23 MED ORDER — AMOXICILLIN-POT CLAVULANATE 875-125 MG PO TABS: 1.0000 | ORAL_TABLET | Freq: Two times a day (BID) | ORAL | 0 refills | Status: DC

## 2016-12-23 NOTE — Progress Notes (Signed)
   Subjective:    Patient ID: Rachael Matthews, female    DOB: 1946-05-28, 70 y.o.   MRN: [id number]  HPI presents for her wellness exam. No pelvic pain. Same sexual partner. Regular vision and dental exams. No regular activity. Plans to return to work part time for several months. Needs bone density for osteoporosis; has been doing yearly Reclast.     Review of Systems  Constitutional: Negative for activity change, appetite change, fatigue and fever.  HENT: Positive for postnasal drip and sinus pressure. Negative for dental problem, ear pain and sore throat.        Started a few days ago; now producing yellow sputum.   Respiratory: Negative for cough, chest tightness, shortness of breath and wheezing.   Cardiovascular: Negative for chest pain.  Gastrointestinal: Negative for abdominal distention, abdominal pain, blood in stool, constipation, diarrhea, nausea and vomiting.  Genitourinary: Negative for difficulty urinating, dysuria, enuresis, frequency, genital sores, pelvic pain, urgency and vaginal discharge.       Objective:   Physical Exam  Constitutional: She is oriented to person, place, and time. She appears well-developed. No distress.  HENT:  Right Ear: External ear normal.  Left Ear: External ear normal.  Mouth/Throat: Oropharynx is clear and moist.  TMs retracted; no erythema. Pharynx injected with PND noted. Neck supple with mild anterior adenopathy.   Neck: Normal range of motion. Neck supple. No tracheal deviation present. No thyromegaly present.  Cardiovascular: Normal rate, regular rhythm and normal heart sounds.  Exam reveals no gallop.   No murmur heard. Pulmonary/Chest: Effort normal and breath sounds normal.  Abdominal: Soft. She exhibits no distension. There is no tenderness.  Genitourinary: Vagina normal. No vaginal discharge found.  Genitourinary Comments: External GU: no rashes or lesions. Vagina: no discharge. Bimanual exam: no tenderness or obvious masses. Rectal  exam: no masses; no stool for hemoccult.   Musculoskeletal: She exhibits no edema.  Neurological: She is alert and oriented to person, place, and time.  Skin: Skin is warm and dry. No rash noted.  Psychiatric: She has a normal mood and affect. Her behavior is normal.  Vitals reviewed. Breast exam: fine nodularity; no masses; axillae no adenopathy.         Assessment & Plan:   Problem List Items Addressed This Visit      Musculoskeletal and Integument   Osteoporosis (Chronic)   Relevant Orders   DG Bone Density     Other   Hyperlipemia    Other Visit Diagnoses    Encounter for well woman exam    -  Primary   Encounter for immunization       Relevant Orders   Flu Vaccine QUAD 36+ mos PF IM (Fluarix & Fluzone Quad PF) (Completed)   Bacterial upper respiratory infection         Meds ordered this encounter  Medications  . amoxicillin-clavulanate (AUGMENTIN) 875-125 MG tablet    Sig: Take 1 tablet by mouth 2 (two) times daily.    Dispense:  20 tablet    Refill:  0    Order Specific Question:   Supervising Provider    Answer:   Mikey Kirschner [[id number]]   Call back if worsens or persists. Patient gets mammograms at [facility]; due in January; she will schedule. Recommend regular walking and daily vitamin D/calcium.  Return for follow up with Dr. Nicki Reaper as planned . Repeat lipid profile next month off cholesterol med.

## 2016-12-23 NOTE — Telephone Encounter (Signed)
Bone Density test scheduled at patient's physical 12/23/16

## 2016-12-30 ENCOUNTER — Ambulatory Visit (HOSPITAL_COMMUNITY)
Admission: RE | Admit: 2016-12-30 | Discharge: 2016-12-30 | Disposition: A | Discharge status: Home / Self Care | Payer: Medicare HMO | Source: Ambulatory Visit | Attending: Nurse Practitioner | Admitting: Nurse Practitioner

## 2016-12-30 DIAGNOSIS — Z780 Asymptomatic menopausal state: Secondary | ICD-10-CM | POA: Diagnosis not present

## 2016-12-30 DIAGNOSIS — M810 Age-related osteoporosis without current pathological fracture: Secondary | ICD-10-CM | POA: Diagnosis not present

## 2017-02-09 ENCOUNTER — Ambulatory Visit (INDEPENDENT_AMBULATORY_CARE_PROVIDER_SITE_OTHER): Payer: Medicare HMO | Admitting: Internal Medicine

## 2017-02-09 ENCOUNTER — Encounter: Payer: Self-pay | Admitting: Internal Medicine

## 2017-02-09 ENCOUNTER — Telehealth: Payer: Self-pay

## 2017-02-09 VITALS — BP 140/75 | HR 78 | Temp 97.9°F | Ht 60.0 in | Wt 130.4 lb

## 2017-02-09 DIAGNOSIS — K219 Gastro-esophageal reflux disease without esophagitis: Secondary | ICD-10-CM

## 2017-02-09 DIAGNOSIS — K580 Irritable bowel syndrome with diarrhea: Secondary | ICD-10-CM

## 2017-02-09 NOTE — Progress Notes (Signed)
Primary Care Physician:  Rachael Lange, MD Primary Gastroenterologist:  Dr. Gala Matthews  Pre-Procedure History & Physical: HPI:  Rachael Matthews is a 71 y.o. female here for follow-up of IBS-D. Has done well over the past year. One or 2 episodes of diarrhea monthly with normal bowel function the remainder of the time as she describes. Several weeks ago she had a bout of diarrhea which lasted 5 days and resolved. Takes Levsin sublingually before meals and at bedtime with good results she has a flare. No rectal bleeding or melena. She has gained 3 pounds since her last visit. Occasional rare reflux symptoms well controlled on ranitidine. She had a previous extensive evaluation for other causes of diarrhea as chronicled in my note from 1 year ago. 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  Past Medical History:  Diagnosis Date  . Depression   . Diverticulosis   . Family history of premature coronary artery disease 04/27/2016  . GERD (gastroesophageal reflux disease)   . Headache(784.0)   . Mixed hyperlipidemia   . MRI of brain abnormal   . Osteoporosis   . Small vessel disease     Past Surgical History:  Procedure Laterality Date  . ABDOMINAL HYSTERECTOMY    . BLADDER SURGERY    . CERVICAL SPINE SURGERY    . COLONOSCOPY  06/09/2007   [id number] papillae otherwise normal  . COLONOSCOPY N/A 12/18/2015   Dr.Hasini Matthews- colonic diverticulosis, melanosis coli, bx= benign, stool sample= negative  . MANDIBLE FRACTURE SURGERY      Prior to Admission medications   Medication Sig Start Date End Date Taking? Authorizing Provider  ALPRAZolam Rachael Matthews) 0.25 MG tablet TAKE 1 TABLET BY MOUTH TWICE DAILY AS NEEDED 12/09/16  Yes Rachael Drown, MD  aspirin 81 MG tablet Take 81 mg by mouth daily.   Yes Historical Provider, MD  hyoscyamine (LEVSIN SL) 0.125 MG SL tablet Place 0.125 mg under the tongue every 4 (four) hours as needed for cramping.   Yes Historical Provider, MD  ranitidine (ZANTAC) 150 MG  tablet Take 1 tablet (150 mg total) by mouth at bedtime. Patient taking differently: Take 150 mg by mouth as needed.  02/12/16  Yes Rachael Drown, MD  topiramate (TOPAMAX) 25 MG tablet TAKE 1 TABLET(25 MG) BY MOUTH TWICE DAILY Patient taking differently: Take on tablet by mouth daily. 09/29/16  Yes Rachael Drown, MD  amoxicillin-clavulanate (AUGMENTIN) 875-125 MG tablet Take 1 tablet by mouth 2 (two) times daily. Patient not taking: Reported on 02/09/2017 12/23/16   Nilda Simmer, NP    Allergies as of 02/09/2017 - Review Complete 02/09/2017  Allergen Reaction Noted  . Codeine  04/28/2013  . Statins  04/28/2013  . Fosamax [alendronate sodium]  07/06/2014    Family History  Problem Relation Age of Onset  . Coronary artery disease Father     Died with MI age 87  . Heart attack Father   . Hypertension Mother   . Heart failure Mother   . Hyperlipidemia Mother   . Osteoporosis Mother   . Hypertension Sister   . Heart disease Brother   . Hypertension Brother   . Colon cancer Neg Hx   . Other Mother     bowel issues    Social History   Social History  . Marital status: Married    Spouse name: N/A  . Number of children: N/A  . Years of education: N/A   Occupational History  . 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 Smoking status: Never Smoker  . Smokeless tobacco: Never Used  . Alcohol use No  . Drug use: No  . Sexual activity: Yes    Birth control/ protection: Surgical   Other Topics Concern  . Not on file   Social History Narrative  . No narrative on file    Review of Systems: See HPI, otherwise negative ROS  Physical Exam: BP 140/75   Pulse 78   Temp 97.9 F (36.6 C) (Oral)   Ht 5' (1.524 m)   Wt 130 lb 6.4 oz (59.1 kg)   BMI 25.47 kg/m  General:   Alert,  Well-developed, well-nourished, pleasant and cooperative in NAD.    Impression:  IBS-D. Symptoms are infrequent. No alarm features.Symptoms intermittent  with long periods were she has no diarrhea. GERD-occasional flare well controlled on H2 blocker therapy.  Recommendations:  Continue Levsin .125 mg SL before meals and at bedtime as needed  May continue Ranitidine as needed for GERD  Office visit with [place] in 1 year and as needed    Notice: This dictation was prepared with Dragon dictation along with smaller phrase technology. Any transcriptional errors that result from this process are unintentional and may not be corrected upon review.

## 2017-02-09 NOTE — Patient Instructions (Signed)
Continue Levsin .125 mg SL before meals and at bedtime as needed  May continue Ranitidine as needed for GERD  Office visit with [place] in 1 year and as needed

## 2017-02-09 NOTE — Telephone Encounter (Signed)
Per Dr. Gala Romney, Levsin 0.125 mg SL #60 called to [facility] to take one AC and QHS prn diarrhea with refills x one year.

## 2017-02-10 ENCOUNTER — Encounter: Payer: Self-pay | Admitting: Family Medicine

## 2017-02-10 DIAGNOSIS — Z1231 Encounter for screening mammogram for malignant neoplasm of breast: Secondary | ICD-10-CM | POA: Diagnosis not present

## 2017-02-15 ENCOUNTER — Telehealth: Payer: Self-pay | Admitting: Family Medicine

## 2017-02-15 NOTE — Telephone Encounter (Signed)
Review screening mammogram results in results folder. °

## 2017-02-16 NOTE — Telephone Encounter (Signed)
No worrisome findings

## 2017-04-28 IMAGING — NM NM MISC PROCEDURE
3 series · 18 of 18 positions shown · non-contrast
Comparison: none

[Series 1: stress-gsp_(id)_sa · 6.4mm · 6.40mm/px · 6 of 512 frames shown]
[frame 43/512]
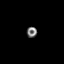
[frame 128/512]
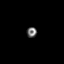
[frame 214/512]
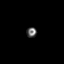
[frame 299/512]
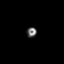
[frame 384/512]
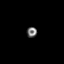
[frame 470/512]
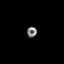

[Series 1: stress-sum-em_(id)_sa · 6.4mm · 6.40mm/px · 6 of 64 frames shown]
[frame 6/64]
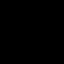
[frame 16/64]
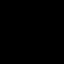
[frame 27/64]
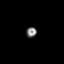
[frame 38/64]
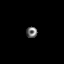
[frame 48/64]
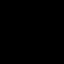
[frame 59/64]
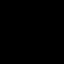

[Series 1: rest_(id)_sa · 6.4mm · 6.40mm/px · 6 of 64 frames shown]
[frame 6/64]
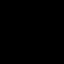
[frame 16/64]
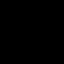
[frame 27/64]
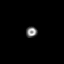
[frame 38/64]
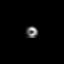
[frame 48/64]
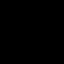
[frame 59/64]
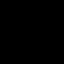

[18 of 18 positions shown; findings below may reference images not displayed]

Canned report from images found in remote index.

Refer to host system for actual result text.

## 2017-07-08 ENCOUNTER — Telehealth: Payer: Self-pay | Admitting: Family Medicine

## 2017-07-08 DIAGNOSIS — E782 Mixed hyperlipidemia: Secondary | ICD-10-CM

## 2017-07-08 DIAGNOSIS — K219 Gastro-esophageal reflux disease without esophagitis: Secondary | ICD-10-CM

## 2017-07-08 DIAGNOSIS — R5383 Other fatigue: Secondary | ICD-10-CM

## 2017-07-08 NOTE — Telephone Encounter (Signed)
Last labs 08/03/16 cbc and bmp And 07/01/16 lipid, liver

## 2017-07-08 NOTE — Telephone Encounter (Signed)
Metabolic 7, lipid, CBC

## 2017-07-08 NOTE — Telephone Encounter (Signed)
Patient has an appt on July 30th for 6 mos recheck and wants to know if she needs to have any labwork done prior to appt.  If so will need an order for [facility].

## 2017-07-09 NOTE — Telephone Encounter (Signed)
Left message return call 07/09/17 ( labs ordered)

## 2017-07-09 NOTE — Telephone Encounter (Signed)
Spoke with patient and informed her per Dr.Scott Luking- Labs have been ordered. Patient verbalized understanding. 

## 2017-07-19 DIAGNOSIS — E782 Mixed hyperlipidemia: Secondary | ICD-10-CM | POA: Diagnosis not present

## 2017-07-19 DIAGNOSIS — K219 Gastro-esophageal reflux disease without esophagitis: Secondary | ICD-10-CM | POA: Diagnosis not present

## 2017-07-19 DIAGNOSIS — R5383 Other fatigue: Secondary | ICD-10-CM | POA: Diagnosis not present

## 2017-07-20 LAB — CBC WITH DIFFERENTIAL/PLATELET
Basophils Absolute: 0.1 10*3/uL (ref 0.0–0.2)
Basos: 1 %
EOS (ABSOLUTE): 0.3 10*3/uL (ref 0.0–0.4)
EOS: 4 %
HEMATOCRIT: 43.9 % (ref 34.0–46.6)
Hemoglobin: 14.5 g/dL (ref 11.1–15.9)
Immature Grans (Abs): 0 10*3/uL (ref 0.0–0.1)
Immature Granulocytes: 0 %
LYMPHS ABS: 3.1 10*3/uL (ref 0.7–3.1)
Lymphs: 35 %
MCH: 29.4 pg (ref 26.6–33.0)
MCHC: 33 g/dL (ref 31.5–35.7)
MCV: 89 fL (ref 79–97)
MONOS ABS: 0.8 10*3/uL (ref 0.1–0.9)
Monocytes: 9 %
Neutrophils Absolute: 4.5 10*3/uL (ref 1.4–7.0)
Neutrophils: 51 %
Platelets: 334 10*3/uL (ref 150–379)
RBC: 4.94 x10E6/uL (ref 3.77–5.28)
RDW: 13.9 % (ref 12.3–15.4)
WBC: 8.7 10*3/uL (ref 3.4–10.8)

## 2017-07-20 LAB — BASIC METABOLIC PANEL
BUN/Creatinine Ratio: 20 (ref 12–28)
BUN: 16 mg/dL (ref 8–27)
CALCIUM: 10.1 mg/dL (ref 8.7–10.3)
CHLORIDE: 106 mmol/L (ref 96–106)
CO2: 23 mmol/L (ref 20–29)
Creatinine, Ser: 0.82 mg/dL (ref 0.57–1.00)
GFR calc non Af Amer: 72 mL/min/{1.73_m2} (ref >59)
GFR, EST AFRICAN AMERICAN: 83 mL/min/{1.73_m2} (ref >59)
Glucose: 91 mg/dL (ref 65–99)
Potassium: 4.9 mmol/L (ref 3.5–5.2)
Sodium: 143 mmol/L (ref 134–144)

## 2017-07-20 LAB — LIPID PANEL
Chol/HDL Ratio: 7.6 ratio — ABNORMAL HIGH (ref 0.0–4.4)
Cholesterol, Total: 326 mg/dL — ABNORMAL HIGH (ref 100–199)
HDL: 43 mg/dL (ref >39)
LDL Calculated: 232 mg/dL — ABNORMAL HIGH (ref 0–99)
Triglycerides: 256 mg/dL — ABNORMAL HIGH (ref 0–149)
VLDL CHOLESTEROL CAL: 51 mg/dL — AB (ref 5–40)

## 2017-07-26 ENCOUNTER — Ambulatory Visit (INDEPENDENT_AMBULATORY_CARE_PROVIDER_SITE_OTHER): Payer: Medicare HMO | Admitting: Family Medicine

## 2017-07-26 ENCOUNTER — Encounter: Payer: Self-pay | Admitting: Family Medicine

## 2017-07-26 VITALS — BP 122/84 | Ht 60.0 in | Wt 128.0 lb

## 2017-07-26 DIAGNOSIS — E782 Mixed hyperlipidemia: Secondary | ICD-10-CM

## 2017-07-26 DIAGNOSIS — M810 Age-related osteoporosis without current pathological fracture: Secondary | ICD-10-CM

## 2017-07-26 DIAGNOSIS — K580 Irritable bowel syndrome with diarrhea: Secondary | ICD-10-CM

## 2017-07-26 MED ORDER — ALPRAZOLAM 0.25 MG PO TABS: 0.2500 mg | ORAL_TABLET | Freq: Two times a day (BID) | ORAL | 5 refills | Status: DC | PRN

## 2017-07-26 MED ORDER — TOPIRAMATE 25 MG PO TABS: ORAL_TABLET | ORAL | 5 refills | Status: DC

## 2017-07-26 NOTE — Progress Notes (Signed)
   Subjective:    Patient ID: Rachael Matthews, female    DOB: [birth year], 71 y.o.   MRN: [id number]  Hyperlipidemia  This is a chronic problem. Pertinent negatives include no chest pain or shortness of breath. Current antihyperlipidemic treatment includes diet change. Compliance problems include adherence to exercise.    Still having problems with stomach. Seeing Dr. Sydell Axon.  Patient with severe IBS symptoms has a lot of loose stools diarrhea denies bloody stools.  Patient with bone density several months ago she states she never heard results message was sent to her via my chart  Patient no longer able to afford injectable medicine for hyperlipidemia. She states she thinks and different medicine is covered she will get the results of that paper from her insurance company and send to [place]  Review of Systems  Constitutional: Negative for activity change, fatigue and fever.  Respiratory: Negative for cough and shortness of breath.   Cardiovascular: Negative for chest pain and leg swelling.  Neurological: Negative for headaches.       Objective:   Physical Exam  Constitutional: She appears well-nourished. No distress.  Cardiovascular: Normal rate, regular rhythm and normal heart sounds.   No murmur heard. Pulmonary/Chest: Effort normal and breath sounds normal. No respiratory distress.  Musculoskeletal: She exhibits no edema.  Lymphadenopathy:    She has no cervical adenopathy.  Neurological: She is alert. She exhibits normal muscle tone.  Psychiatric: Her behavior is normal.  Vitals reviewed.  Lab results reviewed with patient in detail  25 minutes was spent with the patient. Greater than half the time was spent in discussion and answering questions and counseling regarding the issues that the patient came in for today.      Assessment & Plan:  Migraine stable on current medications takes Topamax once a day  Hyperlipidemia poor control because of her history of cerebral vascular  disease recommended for the patient to be back on medication she will find a letter from the insurance company and send it to [place]  Osteoporosis start reclast once per year to help treat this, bone density not due for another 2 years  Severe IBS Imodium in the evening when necessary low refuse diet

## 2017-07-27 ENCOUNTER — Other Ambulatory Visit: Payer: Self-pay | Admitting: Family Medicine

## 2017-07-28 NOTE — Telephone Encounter (Signed)
That beats all-I did a prescription with refills and it was faxed to the pharmacy on the day I saw her. If necessary do it again-geez

## 2017-08-03 ENCOUNTER — Telehealth: Payer: Self-pay | Admitting: Family Medicine

## 2017-08-03 ENCOUNTER — Encounter: Payer: Self-pay | Admitting: Family Medicine

## 2017-08-03 NOTE — Telephone Encounter (Signed)
Patient dropped off a letter for Dr. Nicki Reaper to review that she received from her insurance company regarding her medication.  See in yellow basket.

## 2017-08-05 ENCOUNTER — Telehealth: Payer: Self-pay | Admitting: Family Medicine

## 2017-08-05 NOTE — Telephone Encounter (Signed)
Please complete highlighted area , sign & date order for Reclast so it can be faxed - please forward to [facility] when done   Form in red folder in yellow box

## 2017-08-05 NOTE — Telephone Encounter (Signed)
Completed.

## 2017-08-09 NOTE — Telephone Encounter (Signed)
I reviewed over her information. I recommend Praluent 75 mg subcutaneous every 2 weeks for severely elevated LDL. Patient cannot tolerate statins. She has cerebral vascular disease. This medication is covered by her insurance according to her letter. Would not surprise me if it requires preauthorization

## 2017-08-10 MED ORDER — ALIROCUMAB 75 MG/ML ~~LOC~~ SOPN: 75.0000 mg | PEN_INJECTOR | SUBCUTANEOUS | 1 refills | Status: DC

## 2017-08-10 NOTE — Telephone Encounter (Signed)
Prescription sent electronically to pharmacy. Patient notified. 

## 2017-08-10 NOTE — Discharge Instructions (Signed)

## 2017-08-11 ENCOUNTER — Encounter (HOSPITAL_COMMUNITY)
Admission: RE | Admit: 2017-08-11 | Discharge: 2017-08-11 | Disposition: A | Discharge status: Home / Self Care | Payer: Medicare HMO | Source: Ambulatory Visit | Attending: Family Medicine | Admitting: Family Medicine

## 2017-08-11 ENCOUNTER — Encounter (HOSPITAL_COMMUNITY): Payer: Self-pay

## 2017-08-11 DIAGNOSIS — M810 Age-related osteoporosis without current pathological fracture: Secondary | ICD-10-CM | POA: Insufficient documentation

## 2017-08-11 MED ORDER — ZOLEDRONIC ACID 5 MG/100ML IV SOLN
INTRAVENOUS | Status: AC
  Filled 2017-08-11: qty 100

## 2017-08-11 MED ORDER — SODIUM CHLORIDE 0.9 % IV SOLN
Freq: Once | INTRAVENOUS | Status: AC
  Administered 2017-08-11: 250 mL via INTRAVENOUS

## 2017-08-11 MED ORDER — ZOLEDRONIC ACID 5 MG/100ML IV SOLN
5.0000 mg | Freq: Once | INTRAVENOUS | Status: AC
  Administered 2017-08-11: 5 mg via INTRAVENOUS

## 2017-08-13 ENCOUNTER — Telehealth: Payer: Self-pay | Admitting: *Deleted

## 2017-08-13 NOTE — Telephone Encounter (Signed)
Prior authorization forms for Praluent left in red folder in yellow folder- Pharmacy was calling to check on status of prior authorization

## 2017-08-15 ENCOUNTER — Telehealth: Payer: Self-pay | Admitting: Family Medicine

## 2017-08-15 ENCOUNTER — Encounter: Payer: Self-pay | Admitting: Family Medicine

## 2017-08-15 NOTE — Telephone Encounter (Signed)
This was filled out please attend thank you

## 2017-08-15 NOTE — Telephone Encounter (Signed)
A letter was dictated forwarded to [facility], the form was filled out, please fill-in see, please include most recent cholesterol profile and letter documentation when sending this

## 2017-08-16 ENCOUNTER — Telehealth: Payer: Self-pay | Admitting: *Deleted

## 2017-08-16 NOTE — Telephone Encounter (Signed)
Fax from [place]. praluent approved 12/26/16 - 12/27/17. [facility] pharm notiifed. Letter of approval scanned in chart. Ref # [id number]

## 2017-09-13 DIAGNOSIS — L280 Lichen simplex chronicus: Secondary | ICD-10-CM | POA: Diagnosis not present

## 2017-09-13 DIAGNOSIS — L570 Actinic keratosis: Secondary | ICD-10-CM | POA: Diagnosis not present

## 2017-09-29 ENCOUNTER — Ambulatory Visit: Payer: Medicare HMO | Admitting: Cardiology

## 2017-11-02 ENCOUNTER — Telehealth: Payer: Self-pay | Admitting: Family Medicine

## 2017-11-02 DIAGNOSIS — Z79899 Other long term (current) drug therapy: Secondary | ICD-10-CM

## 2017-11-02 DIAGNOSIS — E785 Hyperlipidemia, unspecified: Secondary | ICD-10-CM

## 2017-11-02 NOTE — Telephone Encounter (Signed)
Patient states she in not having any problems,but wanted to know if she is needing blood work. Also if you want her to continue on the Praluent she will need refills sent to [facility].

## 2017-11-02 NOTE — Telephone Encounter (Signed)
Patient wants to know if she needs to do blood work after having 4 injections of Praluent?

## 2017-11-02 NOTE — Telephone Encounter (Signed)
Lipid, liver, metabolic 7, may have 6 refills, I recommend a follow-up office visit in later November or December it is best to go ahead and schedule now because his schedule is getting tight

## 2017-11-03 MED ORDER — ALIROCUMAB 75 MG/ML ~~LOC~~ SOPN: 75.0000 mg | PEN_INJECTOR | SUBCUTANEOUS | 6 refills | Status: DC

## 2017-11-03 NOTE — Addendum Note (Signed)
Addended by: Karle Barr on: 11/03/2017 11:32 AM   Modules accepted: Orders

## 2017-11-03 NOTE — Telephone Encounter (Signed)
Pt is aware labs ordered and refills given.

## 2017-11-11 DIAGNOSIS — E785 Hyperlipidemia, unspecified: Secondary | ICD-10-CM | POA: Diagnosis not present

## 2017-11-11 DIAGNOSIS — Z79899 Other long term (current) drug therapy: Secondary | ICD-10-CM | POA: Diagnosis not present

## 2017-11-12 LAB — HEPATIC FUNCTION PANEL
ALK PHOS: 48 IU/L (ref 39–117)
ALT: 10 IU/L (ref 0–32)
AST: 20 IU/L (ref 0–40)
Albumin: 3.9 g/dL (ref 3.5–4.8)
Bilirubin Total: 0.4 mg/dL (ref 0.0–1.2)
Bilirubin, Direct: 0.09 mg/dL (ref 0.00–0.40)
TOTAL PROTEIN: 6.9 g/dL (ref 6.0–8.5)

## 2017-11-12 LAB — LIPID PANEL
CHOLESTEROL TOTAL: 152 mg/dL (ref 100–199)
Chol/HDL Ratio: 3.3 ratio (ref 0.0–4.4)
HDL: 46 mg/dL (ref >39)
LDL Calculated: 61 mg/dL (ref 0–99)
Triglycerides: 224 mg/dL — ABNORMAL HIGH (ref 0–149)
VLDL Cholesterol Cal: 45 mg/dL — ABNORMAL HIGH (ref 5–40)

## 2017-11-12 LAB — BASIC METABOLIC PANEL
BUN/Creatinine Ratio: 18 (ref 12–28)
BUN: 14 mg/dL (ref 8–27)
CALCIUM: 9.5 mg/dL (ref 8.7–10.3)
CO2: 24 mmol/L (ref 20–29)
CREATININE: 0.78 mg/dL (ref 0.57–1.00)
Chloride: 107 mmol/L — ABNORMAL HIGH (ref 96–106)
GFR, EST AFRICAN AMERICAN: 88 mL/min/{1.73_m2} (ref >59)
GFR, EST NON AFRICAN AMERICAN: 76 mL/min/{1.73_m2} (ref >59)
Glucose: 79 mg/dL (ref 65–99)
POTASSIUM: 4.6 mmol/L (ref 3.5–5.2)
Sodium: 143 mmol/L (ref 134–144)

## 2017-12-01 ENCOUNTER — Ambulatory Visit: Payer: Medicare HMO | Admitting: Family Medicine

## 2017-12-01 ENCOUNTER — Encounter: Payer: Self-pay | Admitting: Family Medicine

## 2017-12-01 VITALS — BP 112/68 | Ht 60.0 in | Wt 129.0 lb

## 2017-12-01 DIAGNOSIS — K219 Gastro-esophageal reflux disease without esophagitis: Secondary | ICD-10-CM

## 2017-12-01 DIAGNOSIS — E782 Mixed hyperlipidemia: Secondary | ICD-10-CM

## 2017-12-01 DIAGNOSIS — Z23 Encounter for immunization: Secondary | ICD-10-CM

## 2017-12-01 DIAGNOSIS — M810 Age-related osteoporosis without current pathological fracture: Secondary | ICD-10-CM | POA: Diagnosis not present

## 2017-12-01 MED ORDER — ALPRAZOLAM 0.25 MG PO TABS
0.2500 mg | ORAL_TABLET | Freq: Two times a day (BID) | ORAL | 5 refills | Status: DC | PRN
Start: 2017-12-01 — End: 2018-06-06

## 2017-12-01 MED ORDER — RANITIDINE HCL 150 MG PO TABS: 150.0000 mg | ORAL_TABLET | Freq: Two times a day (BID) | ORAL | 5 refills | Status: DC

## 2017-12-01 NOTE — Progress Notes (Signed)
   Subjective:    Patient ID: Rachael Matthews, female    DOB: October 09, 1946, 71 y.o.   MRN: [id number]  Hyperlipidemia  This is a chronic problem. Pertinent negatives include no chest pain or shortness of breath. Treatments tried: alirocumab injection.   Trouble with reflux. Started back on ranitidine about one month ago. Helped some.  She is only taking 1 tablet/day 150 mg she relates some epigastric burning discomfort occasional dysphagia but that is rare Patient does not smoke Flu vaccine today. She does use Xanax occasionally to help her with anxiousness Review of Systems  Constitutional: Negative for activity change, fatigue and fever.  HENT: Negative for congestion.   Respiratory: Negative for cough, chest tightness and shortness of breath.   Cardiovascular: Negative for chest pain and leg swelling.  Gastrointestinal: Negative for abdominal pain.  Skin: Negative for color change.  Neurological: Negative for headaches.  Psychiatric/Behavioral: Negative for behavioral problems.       Objective:   Physical Exam  Constitutional: She appears well-developed and well-nourished. No distress.  HENT:  Head: Normocephalic and atraumatic.  Eyes: Right eye exhibits no discharge. Left eye exhibits no discharge.  Neck: No tracheal deviation present.  Cardiovascular: Normal rate, regular rhythm and normal heart sounds.  No murmur heard. Pulmonary/Chest: Effort normal and breath sounds normal. No respiratory distress. She has no wheezes. She has no rales.  Musculoskeletal: She exhibits no edema.  Lymphadenopathy:    She has no cervical adenopathy.  Neurological: She is alert. She exhibits normal muscle tone.  Skin: Skin is warm and dry. No erythema.  Psychiatric: Her behavior is normal.  Vitals reviewed.         Assessment & Plan:  Reflux-patient wants to work with ranitidine because of less potential for medication side effects I recommend 150 mg 1 twice daily ongoing for reflux I told  her that if she is not dramatically better with her reflux symptoms over the next 2-3 weeks she needs to let us know we would refer her to gastroenterology  Patient is doing really well on her lipid medication we will continue this  Patient uses Xanax sparingly would like to have a refill she was given this  Patient denies being depressed  Osteoporosis patient is on Reclast  Follow-up within 6 months

## 2017-12-02 DIAGNOSIS — Z961 Presence of intraocular lens: Secondary | ICD-10-CM | POA: Diagnosis not present

## 2018-01-04 ENCOUNTER — Encounter: Payer: Self-pay | Admitting: Internal Medicine

## 2018-01-20 ENCOUNTER — Telehealth: Payer: Self-pay | Admitting: Family Medicine

## 2018-01-20 ENCOUNTER — Other Ambulatory Visit: Payer: Self-pay | Admitting: *Deleted

## 2018-01-20 MED ORDER — OSELTAMIVIR PHOSPHATE 75 MG PO CAPS: 75.0000 mg | ORAL_CAPSULE | Freq: Two times a day (BID) | ORAL | 0 refills | Status: DC

## 2018-01-20 NOTE — Telephone Encounter (Signed)
Pt states her granddaughter was diagnosed with the flu and she was around her a couple days this week. Explained to pt that their was nothing to give to prevent her from getting flu. Hand hygiene and try to avoid contact with anyone with the flu. Pt did have her flu vaccine. Pt notified to call back if she starts to have any symptoms. Pt was worried because she is going out of town in a few days.

## 2018-01-20 NOTE — Telephone Encounter (Signed)
Patient has been exposed to a confirmed case of Influenza A and wants to know if there is anything she should take precautionary to avoid getting it?

## 2018-01-20 NOTE — Telephone Encounter (Signed)
If she would like a printed prescription of Tamiflu we can do that.  Tamiflu 75 mg 1 twice daily #10.  If she was unlike enough to have severe onset of the flu high fevers body aches headache she could start the medication twice daily with a snack the patient should be aware that in a small number of people it can trigger nausea or sometimes cannot tolerate it because of nausea and vomiting.  Obviously if she had any other symptoms that were not responding she ought to be seen in a urgent care or ER

## 2018-01-20 NOTE — Telephone Encounter (Signed)
Discussed with pt. Pt verbalized understanding. tamiflu printed for pt to pickup and take if she starts to have symptoms.

## 2018-01-26 ENCOUNTER — Other Ambulatory Visit: Payer: Self-pay | Admitting: Family Medicine

## 2018-01-26 ENCOUNTER — Ambulatory Visit: Payer: Medicare HMO | Admitting: Family Medicine

## 2018-01-27 ENCOUNTER — Telehealth: Payer: Self-pay | Admitting: Family Medicine

## 2018-01-27 NOTE — Telephone Encounter (Signed)
Pt mailed a letter regarding a medication that her insurance is rejecting. Letter is in red folder in office.

## 2018-01-31 NOTE — Telephone Encounter (Signed)
Nurses-this patient has tried several statins and has had side effects including myalgias headaches nausea and memory dysfunction.  She suffers with severe hyperlipidemia.  On no medications her LDL was 232.  She also suffers from cerebral vascular disease and she has a history of a stroke.  On the injectable medication Praluent she has had significant improvement with her LDL being 61.  This medication is medically necessary to help her prevent more cerebral vascular disease and stroke.  It is important that we get this approved.  If you need assistance filling out any type of approval form please bring it to my attention.  If a letter as needed let me know that as well.  This medication very important for the patient-please see attached forms

## 2018-02-01 NOTE — Telephone Encounter (Signed)
Let me know if you need anything from me. Thanks

## 2018-03-01 ENCOUNTER — Other Ambulatory Visit: Payer: Self-pay

## 2018-03-01 ENCOUNTER — Telehealth: Payer: Self-pay

## 2018-03-01 ENCOUNTER — Encounter: Payer: Self-pay | Admitting: Internal Medicine

## 2018-03-01 ENCOUNTER — Ambulatory Visit: Payer: Medicare HMO | Admitting: Internal Medicine

## 2018-03-01 VITALS — BP 123/69 | HR 80 | Temp 97.3°F | Ht 60.0 in | Wt 131.0 lb

## 2018-03-01 DIAGNOSIS — Z8719 Personal history of other diseases of the digestive system: Secondary | ICD-10-CM | POA: Diagnosis not present

## 2018-03-01 DIAGNOSIS — R1310 Dysphagia, unspecified: Secondary | ICD-10-CM | POA: Diagnosis not present

## 2018-03-01 DIAGNOSIS — R1319 Other dysphagia: Secondary | ICD-10-CM

## 2018-03-01 DIAGNOSIS — K219 Gastro-esophageal reflux disease without esophagitis: Secondary | ICD-10-CM | POA: Diagnosis not present

## 2018-03-01 NOTE — Progress Notes (Signed)
Primary Care Physician:  Kathyrn Drown, MD Primary Gastroenterologist:  Dr. Gala Romney  Pre-Procedure History & Physical: HPI:  Rachael Matthews is a 72 y.o. female here for insidiously worsening reflux symptoms over the past 4 months. Taking ranitidine 150 mg once to twice daily. Poor response with this treatment. Reflux symptoms over 3 times weekly. Vague intermittent dysphagia. No recent EGD. In fact, states her last EGD was when she was in her 46s many ,many years ago. No PPI.  Occasional postprandial abdominal cramps and diarrhea. Takes Imodium one or 2 times monthly with good control of the symptoms. Felt Levsin did not help. History diverticulosis and adenoma on prior colonoscopy; due for surveillance colonoscopy 2023.   Past Medical History:  Diagnosis Date  . Depression   . Diverticulosis   . Family history of premature coronary artery disease 04/27/2016  . GERD (gastroesophageal reflux disease)   . Headache(784.0)   . Mixed hyperlipidemia   . MRI of brain abnormal   . Osteoporosis   . Small vessel disease     Past Surgical History:  Procedure Laterality Date  . ABDOMINAL HYSTERECTOMY    . BLADDER SURGERY    . CERVICAL SPINE SURGERY    . COLONOSCOPY  06/09/2007   VVO:HYWV papillae otherwise normal  . COLONOSCOPY N/A 12/18/2015   Dr.Emmons Toth- colonic diverticulosis, melanosis coli, bx= benign, stool sample= negative  . MANDIBLE FRACTURE SURGERY      Prior to Admission medications   Medication Sig Start Date End Date Taking? Authorizing Provider  Alirocumab (PRALUENT) 75 MG/ML SOPN Inject 75 mg every 14 (fourteen) days into the skin. 11/03/17  Yes Kathyrn Drown, MD  ALPRAZolam Duanne Moron) 0.25 MG tablet Take 1 tablet (0.25 mg total) by mouth 2 (two) times daily as needed. 12/01/17  Yes Luking, Elayne Snare, MD  hyoscyamine (LEVSIN SL) 0.125 MG SL tablet Place 0.125 mg under the tongue every 4 (four) hours as needed for cramping.   Yes [provider]  Lactase (LACTAID PO)  Take by mouth.   Yes [provider]  loperamide (IMODIUM) 2 MG capsule Take by mouth as needed for diarrhea or loose stools.   Yes [provider]  ranitidine (ZANTAC) 150 MG tablet Take 1 tablet (150 mg total) by mouth 2 (two) times daily. 12/01/17  Yes Kathyrn Drown, MD  topiramate (TOPAMAX) 25 MG tablet TAKE 1 TABLET BY MOUTH DAILY 01/26/18  Yes Luking, Scott A, MD  zoledronic acid (RECLAST) 5 MG/100ML SOLN injection Inject 5 mg into the vein once. Last dose was 07/2017   Yes [provider]  aspirin 81 MG tablet Take 81 mg by mouth daily.    [provider]  fexofenadine (ALLEGRA ALLERGY) 180 MG tablet Take 180 mg by mouth daily.    [provider]  magnesium oxide (MAG-OX) 400 MG tablet Take 400 mg by mouth daily.    [provider]  oseltamivir (TAMIFLU) 75 MG capsule Take 1 capsule (75 mg total) by mouth 2 (two) times daily. Patient not taking: Reported on 03/01/2018 01/20/18   Kathyrn Drown, MD    Allergies as of 03/01/2018 - Review Complete 03/01/2018  Allergen Reaction Noted  . Codeine  04/28/2013  . Statins  04/28/2013  . Fosamax [alendronate sodium]  07/06/2014    Family History  Problem Relation Age of Onset  . Coronary artery disease Father        Died with MI age 41  . Heart attack Father   .  Hypertension Mother   . Heart failure Mother   . Hyperlipidemia Mother   . Osteoporosis Mother   . Other Mother        bowel issues  . Hypertension Sister   . Heart disease Brother   . Hypertension Brother   . Colon cancer Neg Hx     Social History   Socioeconomic History  . Marital status: Married    Spouse name: Not on file  . Number of children: Not on file  . Years of education: Not on file  . Highest education level: Not on file  Social Needs  . Financial resource strain: Not on file  . Food insecurity - worry: Not on file  . Food insecurity - inability: Not on file  . Transportation needs - medical: Not on  file  . Transportation needs - non-medical: Not on file  Occupational History  . Occupation: Retired    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  Tobacco Use  . Smoking status: Never Smoker  . Smokeless tobacco: Never Used  Substance and Sexual Activity  . Alcohol use: No  . Drug use: No  . Sexual activity: Yes    Birth control/protection: Surgical  Other Topics Concern  . Not on file  Social History Narrative  . Not on file    Review of Systems: See HPI, otherwise negative ROS  Physical Exam: BP 123/69   Pulse 80   Temp (!) 97.3 F (36.3 C) (Oral)   Ht 5' (1.524 m)   Wt 131 lb (59.4 kg)   BMI 25.58 kg/m  General:   Alert,  Well-developed, well-nourished, pleasant and cooperative in NAD Neck:  Supple; no masses or thyromegaly. No significant cervical adenopathy. Lungs:  Clear throughout to auscultation.   No wheezes, crackles, or rhonchi. No acute distress. Heart:  Regular rate and rhythm; no murmurs, clicks, rubs,  or gallops. Abdomen: Non-distended, normal bowel sounds.  Soft and nontender without appreciable mass or hepatosplenomegaly.  Pulses:  Normal pulses noted. Extremities:  Without clubbing or edema.  Impression: 72 year old lady with intermittent IBS -D symptoms controlled with Imodium. She does well the majority of the time. Somewhat refractory reflux symptoms on H2 blocker therapy. Now with vague intermittent esophageal dysphagia. No recent EGD.  Recommendations:  I have offered the patient an EGD with esophageal dilation as feasible/appropriate in the near future.  The risks, benefits, limitations, alternatives and imponderables have been reviewed with the patient. Potential for esophageal dilation, biopsy, etc. have also been reviewed.  Questions have been answered. All parties agreeable.  Continue to use Imodium on a when necessary basis for IBS D symptoms.  Plan for surveillance colonoscopy 2023.  Further recommendations to follow.   Notice: This  dictation was prepared with Dragon dictation along with smaller phrase technology. Any transcriptional errors that result from this process are unintentional and may not be corrected upon review.

## 2018-03-01 NOTE — Telephone Encounter (Signed)
Called and informed pt of pre-op appt 03/28/18 at 8:00am. Letter mailed.

## 2018-03-01 NOTE — Patient Instructions (Signed)
Schedule an EGD with ED as appropriate - refractory GERD and dysphagia - Propofol  GERD information provided  Continue Imodium as needed for occasional IBS symptoms  Further recommendations to follow

## 2018-03-23 NOTE — Patient Instructions (Signed)
Rachael Matthews  03/23/2018     @PREFPERIOPPHARMACY @   Your procedure is scheduled on  04/04/2018.  Report to [facility] at  640   A.M.  Call this number if you have problems the morning of surgery:  [phone]   Remember:  Do not eat food or drink liquids after midnight.  Take these medicines the morning of surgery with A SIP OF WATER  Xanax, allegra, zantac, topamax.   Do not wear jewelry, make-up or nail polish.  Do not wear lotions, powders, or perfumes, or deodorant.  Do not shave 48 hours prior to surgery.  Men may shave face and neck.  Do not bring valuables to the hospital.  [facility] is not responsible for any belongings or valuables.  Contacts, dentures or bridgework may not be worn into surgery.  Leave your suitcase in the car.  After surgery it may be brought to your room.  For patients admitted to the hospital, discharge time will be determined by your treatment team.  Patients discharged the day of surgery will not be allowed to drive home.   Name and phone number of your driver:   family Special instructions:   Follow the diet instructions given to you by Dr Roseanne Kaufman office.  Please read over the following fact sheets that you were given. Anesthesia Post-op Instructions and Care and Recovery After Surgery       Esophagogastroduodenoscopy Esophagogastroduodenoscopy (EGD) is a procedure to examine the lining of the esophagus, stomach, and first part of the small intestine (duodenum). This procedure is done to check for problems such as inflammation, bleeding, ulcers, or growths. During this procedure, a long, flexible, lighted tube with a camera attached (endoscope) is inserted down the throat. Tell a health care provider about:  Any allergies you have.  All medicines you are taking, including vitamins, herbs, eye drops, creams, and over-the-counter medicines.  Any problems you or family members have had with anesthetic medicines.  Any  blood disorders you have.  Any surgeries you have had.  Any medical conditions you have.  Whether you are pregnant or may be pregnant. What are the risks? Generally, this is a safe procedure. However, problems may occur, including:  Infection.  Bleeding.  A tear (perforation) in the esophagus, stomach, or duodenum.  Trouble breathing.  Excessive sweating.  Spasms of the larynx.  A slowed heartbeat.  Low blood pressure.  What happens before the procedure?  Follow instructions from your health care provider about eating or drinking restrictions.  Ask your health care provider about: ? Changing or stopping your regular medicines. This is especially important if you are taking diabetes medicines or blood thinners. ? Taking medicines such as aspirin and ibuprofen. These medicines can thin your blood. Do not take these medicines before your procedure if your health care provider instructs you not to.  Plan to have someone take you home after the procedure.  If you wear dentures, be ready to remove them before the procedure. What happens during the procedure?  To reduce your risk of infection, your health care team will wash or sanitize their hands.  An IV tube will be put in a vein in your hand or arm. You will get medicines and fluids through this tube.  You will be given one or more of the following: ? A medicine to help you relax (sedative). ? A medicine to numb the area (local anesthetic). This medicine  may be sprayed into your throat. It will make you feel more comfortable and keep you from gagging or coughing during the procedure. ? A medicine for pain.  A mouth guard may be placed in your mouth to protect your teeth and to keep you from biting on the endoscope.  You will be asked to lie on your left side.  The endoscope will be lowered down your throat into your esophagus, stomach, and duodenum.  Air will be put into the endoscope. This will help your health  care provider see better.  The lining of your esophagus, stomach, and duodenum will be examined.  Your health care provider may: ? Take a tissue sample so it can be looked at in a lab (biopsy). ? Remove growths. ? Remove objects (foreign bodies) that are stuck. ? Treat any bleeding with medicines or other devices that stop tissue from bleeding. ? Widen (dilate) or stretch narrowed areas of your esophagus and stomach.  The endoscope will be taken out. The procedure may vary among health care providers and hospitals. What happens after the procedure?  Your blood pressure, heart rate, breathing rate, and blood oxygen level will be monitored often until the medicines you were given have worn off.  Do not eat or drink anything until the numbing medicine has worn off and your gag reflex has returned. This information is not intended to replace advice given to you by your health care provider. Make sure you discuss any questions you have with your health care provider. Document Released: 04/16/2005 Document Revised: 05/21/2016 Document Reviewed: 11/07/2015 Elsevier Interactive Patient Education  2018 [employer]. Esophagogastroduodenoscopy, Care After Refer to this sheet in the next few weeks. These instructions provide you with information about caring for yourself after your procedure. Your health care provider may also give you more specific instructions. Your treatment has been planned according to current medical practices, but problems sometimes occur. Call your health care provider if you have any problems or questions after your procedure. What can I expect after the procedure? After the procedure, it is common to have:  A sore throat.  Nausea.  Bloating.  Dizziness.  Fatigue.  Follow these instructions at home:  Do not eat or drink anything until the numbing medicine (local anesthetic) has worn off and your gag reflex has returned. You will know that the local anesthetic has  worn off when you can swallow comfortably.  Do not drive for 24 hours if you received a medicine to help you relax (sedative).  If your health care provider took a tissue sample for testing during the procedure, make sure to get your test results. This is your responsibility. Ask your health care provider or the department performing the test when your results will be ready.  Keep all follow-up visits as told by your health care provider. This is important. Contact a health care provider if:  You cannot stop coughing.  You are not urinating.  You are urinating less than usual. Get help right away if:  You have trouble swallowing.  You cannot eat or drink.  You have throat or chest pain that gets worse.  You are dizzy or light-headed.  You faint.  You have nausea or vomiting.  You have chills.  You have a fever.  You have severe abdominal pain.  You have black, tarry, or bloody stools. This information is not intended to replace advice given to you by your health care provider. Make sure you discuss any questions you have  with your health care provider. Document Released: 11/30/2012 Document Revised: 05/21/2016 Document Reviewed: 11/07/2015 Elsevier Interactive Patient Education  2018 [employer].  Esophageal Dilatation Esophageal dilatation is a procedure to open a blocked or narrowed part of the esophagus. The esophagus is the long tube in your throat that carries food and liquid from your mouth to your stomach. The procedure is also called esophageal dilation. You may need this procedure if you have a buildup of scar tissue in your esophagus that makes it difficult, painful, or even impossible to swallow. This can be caused by gastroesophageal reflux disease (GERD). In rare cases, people need this procedure because they have cancer of the esophagus or a problem with the way food moves through the esophagus. Sometimes you may need to have another dilatation to enlarge the  opening of the esophagus gradually. Tell a health care provider about:  Any allergies you have.  All medicines you are taking, including vitamins, herbs, eye drops, creams, and over-the-counter medicines.  Any problems you or family members have had with anesthetic medicines.  Any blood disorders you have.  Any surgeries you have had.  Any medical conditions you have.  Any antibiotic medicines you are required to take before dental procedures. What are the risks? Generally, this is a safe procedure. However, problems can occur and include:  Bleeding from a tear in the lining of the esophagus.  A hole (perforation) in the esophagus.  What happens before the procedure?  Do not eat or drink anything after midnight on the night before the procedure or as directed by your health care provider.  Ask your health care provider about changing or stopping your regular medicines. This is especially important if you are taking diabetes medicines or blood thinners.  Plan to have someone take you home after the procedure. What happens during the procedure?  You will be given a medicine that makes you relaxed and sleepy (sedative).  A medicine may be sprayed or gargled to numb the back of the throat.  Your health care provider can use various instruments to do an esophageal dilatation. During the procedure, the instrument used will be placed in your mouth and passed down into your esophagus. Options include: ? Simple dilators. This instrument is carefully placed in the esophagus to stretch it. ? Guided wire bougies. In this method, a flexible tube (endoscope) is used to insert a wire into the esophagus. The dilator is passed over this wire to enlarge the esophagus. Then the wire is removed. ? Balloon dilators. An endoscope with a small balloon at the end is passed down into the esophagus. Inflating the balloon gently stretches the esophagus and opens it up. What happens after the  procedure?  Your blood pressure, heart rate, breathing rate, and blood oxygen level will be monitored often until the medicines you were given have worn off.  Your throat may feel slightly sore and will probably still feel numb. This will improve slowly over time.  You will not be allowed to eat or drink until the throat numbness has resolved.  If this is a same-day procedure, you may be allowed to go home once you have been able to drink, urinate, and sit on the edge of the bed without nausea or dizziness.  If this is a same-day procedure, you should have a friend or family member with you for the next 24 hours after the procedure. This information is not intended to replace advice given to you by your health care provider.  Make sure you discuss any questions you have with your health care provider. Document Released: 02/04/2006 Document Revised: 05/21/2016 Document Reviewed: 04/25/2014 Elsevier Interactive Patient Education  2018 [place] Anesthesia is a term that refers to techniques, procedures, and medicines that help a person stay safe and comfortable during a medical procedure. Monitored anesthesia care, or sedation, is one type of anesthesia. Your anesthesia specialist may recommend sedation if you will be having a procedure that does not require you to be unconscious, such as:  Cataract surgery.  A dental procedure.  A biopsy.  A colonoscopy.  During the procedure, you may receive a medicine to help you relax (sedative). There are three levels of sedation:  Mild sedation. At this level, you may feel awake and relaxed. You will be able to follow directions.  Moderate sedation. At this level, you will be sleepy. You may not remember the procedure.  Deep sedation. At this level, you will be asleep. You will not remember the procedure.  The more medicine you are given, the deeper your level of sedation will be. Depending on how you respond to  the procedure, the anesthesia specialist may change your level of sedation or the type of anesthesia to fit your needs. An anesthesia specialist will monitor you closely during the procedure. Let your health care provider know about:  Any allergies you have.  All medicines you are taking, including vitamins, herbs, eye drops, creams, and over-the-counter medicines.  Any use of steroids (by mouth or as a cream).  Any problems you or family members have had with sedatives and anesthetic medicines.  Any blood disorders you have.  Any surgeries you have had.  Any medical conditions you have, such as sleep apnea.  Whether you are pregnant or may be pregnant.  Any use of cigarettes, alcohol, or street drugs. What are the risks? Generally, this is a safe procedure. However, problems may occur, including:  Getting too much medicine (oversedation).  Nausea.  Allergic reaction to medicines.  Trouble breathing. If this happens, a breathing tube may be used to help with breathing. It will be removed when you are awake and breathing on your own.  Heart trouble.  Lung trouble.  Before the procedure Staying hydrated Follow instructions from your health care provider about hydration, which may include:  Up to 2 hours before the procedure - you may continue to drink clear liquids, such as water, clear fruit juice, black coffee, and plain tea.  Eating and drinking restrictions Follow instructions from your health care provider about eating and drinking, which may include:  8 hours before the procedure - stop eating heavy meals or foods such as meat, fried foods, or fatty foods.  6 hours before the procedure - stop eating light meals or foods, such as toast or cereal.  6 hours before the procedure - stop drinking milk or drinks that contain milk.  2 hours before the procedure - stop drinking clear liquids.  Medicines Ask your health care provider about:  Changing or stopping your  regular medicines. This is especially important if you are taking diabetes medicines or blood thinners.  Taking medicines such as aspirin and ibuprofen. These medicines can thin your blood. Do not take these medicines before your procedure if your health care provider instructs you not to.  Tests and exams  You will have a physical exam.  You may have blood tests done to show: ? How well your kidneys and liver are working. ?  How well your blood can clot.  General instructions  Plan to have someone take you home from the hospital or clinic.  If you will be going home right after the procedure, plan to have someone with you for 24 hours.  What happens during the procedure?  Your blood pressure, heart rate, breathing, level of pain and overall condition will be monitored.  An IV tube will be inserted into one of your veins.  Your anesthesia specialist will give you medicines as needed to keep you comfortable during the procedure. This may mean changing the level of sedation.  The procedure will be performed. After the procedure  Your blood pressure, heart rate, breathing rate, and blood oxygen level will be monitored until the medicines you were given have worn off.  Do not drive for 24 hours if you received a sedative.  You may: ? Feel sleepy, clumsy, or nauseous. ? Feel forgetful about what happened after the procedure. ? Have a sore throat if you had a breathing tube during the procedure. ? Vomit. This information is not intended to replace advice given to you by your health care provider. Make sure you discuss any questions you have with your health care provider. Document Released: 09/09/2005 Document Revised: 05/22/2016 Document Reviewed: 04/05/2016 Elsevier Interactive Patient Education  2018 [place], Care After These instructions provide you with information about caring for yourself after your procedure. Your health care provider may  also give you more specific instructions. Your treatment has been planned according to current medical practices, but problems sometimes occur. Call your health care provider if you have any problems or questions after your procedure. What can I expect after the procedure? After your procedure, it is common to:  Feel sleepy for several hours.  Feel clumsy and have poor balance for several hours.  Feel forgetful about what happened after the procedure.  Have poor judgment for several hours.  Feel nauseous or vomit.  Have a sore throat if you had a breathing tube during the procedure.  Follow these instructions at home: For at least 24 hours after the procedure:   Do not: ? Participate in activities in which you could fall or become injured. ? Drive. ? Use heavy machinery. ? Drink alcohol. ? Take sleeping pills or medicines that cause drowsiness. ? Make important decisions or sign legal documents. ? Take care of children on your own.  Rest. Eating and drinking  Follow the diet that is recommended by your health care provider.  If you vomit, drink water, juice, or soup when you can drink without vomiting.  Make sure you have little or no nausea before eating solid foods. General instructions  Have a responsible adult stay with you until you are awake and alert.  Take over-the-counter and prescription medicines only as told by your health care provider.  If you smoke, do not smoke without supervision.  Keep all follow-up visits as told by your health care provider. This is important. Contact a health care provider if:  You keep feeling nauseous or you keep vomiting.  You feel light-headed.  You develop a rash.  You have a fever. Get help right away if:  You have trouble breathing. This information is not intended to replace advice given to you by your health care provider. Make sure you discuss any questions you have with your health care provider. Document  Released: 04/05/2016 Document Revised: 08/05/2016 Document Reviewed: 04/05/2016 Elsevier Interactive Patient Education  [employer].

## 2018-03-28 ENCOUNTER — Encounter (HOSPITAL_COMMUNITY)
Admission: RE | Admit: 2018-03-28 | Discharge: 2018-03-28 | Disposition: A | Discharge status: Home / Self Care | Payer: Medicare HMO | Source: Ambulatory Visit | Attending: Internal Medicine | Admitting: Internal Medicine

## 2018-03-28 ENCOUNTER — Other Ambulatory Visit: Payer: Self-pay

## 2018-03-28 ENCOUNTER — Encounter (HOSPITAL_COMMUNITY): Payer: Self-pay

## 2018-03-28 DIAGNOSIS — Z01810 Encounter for preprocedural cardiovascular examination: Secondary | ICD-10-CM | POA: Insufficient documentation

## 2018-03-28 DIAGNOSIS — Z01812 Encounter for preprocedural laboratory examination: Secondary | ICD-10-CM | POA: Diagnosis not present

## 2018-03-28 DIAGNOSIS — R9431 Abnormal electrocardiogram [ECG] [EKG]: Secondary | ICD-10-CM | POA: Diagnosis not present

## 2018-03-28 HISTORY — DX: Sleep apnea, unspecified: G47.30

## 2018-03-28 LAB — BASIC METABOLIC PANEL
Anion gap: 9 (ref 5–15)
BUN: 13 mg/dL (ref 6–20)
CALCIUM: 9.5 mg/dL (ref 8.9–10.3)
CHLORIDE: 105 mmol/L (ref 101–111)
CO2: 24 mmol/L (ref 22–32)
CREATININE: 0.75 mg/dL (ref 0.44–1.00)
GFR calc non Af Amer: >60 mL/min (ref >60)
Glucose, Bld: 97 mg/dL (ref 65–99)
Potassium: 3.8 mmol/L (ref 3.5–5.1)
SODIUM: 138 mmol/L (ref 135–145)

## 2018-03-28 LAB — CBC WITH DIFFERENTIAL/PLATELET
BASOS PCT: 1 %
Basophils Absolute: 0.1 10*3/uL (ref 0.0–0.1)
EOS ABS: 0.3 10*3/uL (ref 0.0–0.7)
EOS PCT: 4 %
HCT: 43.3 % (ref 36.0–46.0)
HEMOGLOBIN: 14 g/dL (ref 12.0–15.0)
LYMPHS ABS: 2.7 10*3/uL (ref 0.7–4.0)
Lymphocytes Relative: 36 %
MCH: 29.3 pg (ref 26.0–34.0)
MCHC: 32.3 g/dL (ref 30.0–36.0)
MCV: 90.6 fL (ref 78.0–100.0)
MONO ABS: 0.7 10*3/uL (ref 0.1–1.0)
MONOS PCT: 10 %
NEUTROS PCT: 49 %
Neutro Abs: 3.7 10*3/uL (ref 1.7–7.7)
PLATELETS: 269 10*3/uL (ref 150–400)
RBC: 4.78 MIL/uL (ref 3.87–5.11)
RDW: 13.1 % (ref 11.5–15.5)
WBC: 7.5 10*3/uL (ref 4.0–10.5)

## 2018-04-04 ENCOUNTER — Other Ambulatory Visit: Payer: Self-pay

## 2018-04-04 ENCOUNTER — Ambulatory Visit (HOSPITAL_COMMUNITY): Payer: Medicare HMO | Admitting: Anesthesiology

## 2018-04-04 ENCOUNTER — Encounter (HOSPITAL_COMMUNITY)
Admission: RE | Disposition: A | Discharge status: Home / Self Care | Payer: Self-pay | Source: Ambulatory Visit | Attending: Internal Medicine

## 2018-04-04 ENCOUNTER — Encounter (HOSPITAL_COMMUNITY): Payer: Self-pay | Admitting: *Deleted

## 2018-04-04 ENCOUNTER — Ambulatory Visit (HOSPITAL_COMMUNITY)
Admission: RE | Admit: 2018-04-04 | Discharge: 2018-04-04 | Disposition: A | Discharge status: Home / Self Care | Payer: Medicare HMO | Source: Ambulatory Visit | Attending: Internal Medicine | Admitting: Internal Medicine

## 2018-04-04 DIAGNOSIS — Z90710 Acquired absence of both cervix and uterus: Secondary | ICD-10-CM | POA: Diagnosis not present

## 2018-04-04 DIAGNOSIS — K222 Esophageal obstruction: Secondary | ICD-10-CM | POA: Insufficient documentation

## 2018-04-04 DIAGNOSIS — K449 Diaphragmatic hernia without obstruction or gangrene: Secondary | ICD-10-CM | POA: Diagnosis not present

## 2018-04-04 DIAGNOSIS — Z888 Allergy status to other drugs, medicaments and biological substances status: Secondary | ICD-10-CM | POA: Insufficient documentation

## 2018-04-04 DIAGNOSIS — K219 Gastro-esophageal reflux disease without esophagitis: Secondary | ICD-10-CM | POA: Diagnosis not present

## 2018-04-04 DIAGNOSIS — Z8249 Family history of ischemic heart disease and other diseases of the circulatory system: Secondary | ICD-10-CM | POA: Insufficient documentation

## 2018-04-04 DIAGNOSIS — Z8262 Family history of osteoporosis: Secondary | ICD-10-CM | POA: Diagnosis not present

## 2018-04-04 DIAGNOSIS — Z8379 Family history of other diseases of the digestive system: Secondary | ICD-10-CM | POA: Diagnosis not present

## 2018-04-04 DIAGNOSIS — E782 Mixed hyperlipidemia: Secondary | ICD-10-CM | POA: Insufficient documentation

## 2018-04-04 DIAGNOSIS — R1310 Dysphagia, unspecified: Secondary | ICD-10-CM | POA: Diagnosis not present

## 2018-04-04 DIAGNOSIS — Z79899 Other long term (current) drug therapy: Secondary | ICD-10-CM | POA: Diagnosis not present

## 2018-04-04 DIAGNOSIS — R51 Headache: Secondary | ICD-10-CM | POA: Insufficient documentation

## 2018-04-04 DIAGNOSIS — F329 Major depressive disorder, single episode, unspecified: Secondary | ICD-10-CM | POA: Diagnosis not present

## 2018-04-04 DIAGNOSIS — R0602 Shortness of breath: Secondary | ICD-10-CM | POA: Diagnosis not present

## 2018-04-04 DIAGNOSIS — R1314 Dysphagia, pharyngoesophageal phase: Secondary | ICD-10-CM | POA: Diagnosis not present

## 2018-04-04 DIAGNOSIS — G4730 Sleep apnea, unspecified: Secondary | ICD-10-CM | POA: Diagnosis not present

## 2018-04-04 DIAGNOSIS — R69 Illness, unspecified: Secondary | ICD-10-CM | POA: Diagnosis not present

## 2018-04-04 DIAGNOSIS — Z885 Allergy status to narcotic agent status: Secondary | ICD-10-CM | POA: Diagnosis not present

## 2018-04-04 HISTORY — PX: ESOPHAGOGASTRODUODENOSCOPY (EGD) WITH PROPOFOL: SHX5813

## 2018-04-04 HISTORY — PX: MALONEY DILATION: SHX5535

## 2018-04-04 SURGERY — ESOPHAGOGASTRODUODENOSCOPY (EGD) WITH PROPOFOL
Anesthesia: General

## 2018-04-04 MED ORDER — LACTATED RINGERS IV SOLN
INTRAVENOUS | Status: DC | PRN
  Administered 2018-04-04: 08:00:00 via INTRAVENOUS

## 2018-04-04 MED ORDER — PROPOFOL 10 MG/ML IV BOLUS
INTRAVENOUS | Status: DC | PRN
  Administered 2018-04-04 (×4): 20 mg via INTRAVENOUS

## 2018-04-04 MED ORDER — PROPOFOL 10 MG/ML IV BOLUS
INTRAVENOUS | Status: AC
  Filled 2018-04-04: qty 20

## 2018-04-04 NOTE — Transfer of Care (Signed)
Immediate Anesthesia Transfer of Care Note  Patient: Rachael Matthews  Procedure(s) Performed: ESOPHAGOGASTRODUODENOSCOPY (EGD) WITH PROPOFOL (N/A ) MALONEY DILATION (N/A )  Patient Location: PACU  Anesthesia Type:MAC  Level of Consciousness: awake, alert , oriented and patient cooperative  Airway & Oxygen Therapy: Patient Spontanous Breathing and Patient connected to nasal cannula oxygen  Post-op Assessment: Report given to RN, Post -op Vital signs reviewed and stable and Patient moving all extremities X 4  Post vital signs: Reviewed and stable  Last Vitals:  Vitals Value Taken Time  BP 95/58 04/04/2018  9:03 AM  Temp 36.5 C 04/04/2018  9:00 AM  Pulse 75 04/04/2018  9:03 AM  Resp 24 04/04/2018  9:03 AM  SpO2 94 % 04/04/2018  9:03 AM  Vitals shown include unvalidated device data.  Last Pain:  Vitals:   04/04/18 0838  TempSrc:   PainSc: 0-No pain      Patients Stated Pain Goal: 8 (81/77/11 6579)  Complications: No apparent anesthesia complications

## 2018-04-04 NOTE — Op Note (Signed)
[facility] Patient Name: Rachael Matthews Procedure Date: 04/04/2018 8:15 AM MRN: [id number] Date of Birth: [birth year] Attending MD: Norvel Richards , MD CSN: [id number] Age: 72 Admit Type: Outpatient Procedure:                Upper GI endoscopy Indications:              Dysphagia, Heartburn Providers:                Norvel Richards, MD, Jeanann Lewandowsky. Sharon Seller, RN,                            Nelma Rothman, Technician Referring MD:              Medicines:                Propofol per Anesthesia Complications:            No immediate complications. Estimated Blood Loss:     Estimated blood loss was minimal. Procedure:                After obtaining informed consent, the endoscope was                            passed under direct vision. Throughout the                            procedure, the patient's blood pressure, pulse, and                            oxygen saturations were monitored continuously. The                            [id number] ([id number]) scope was introduced through the                            and advanced to the second part of duodenum. The                            patient tolerated the procedure well. Scope In: 8:47:09 AM Scope Out: 8:52:16 AM Total Procedure Duration: 0 hours 5 minutes 7 seconds  Findings:      A non-obstructing Schatzki ring was found at the gastroesophageal       junction. No esophagitis.      No Barrett's epithelium seen      , The scope was withdrawn. Dilation was performed with a Maloney dilator       with mild resistance at 91 Fr. The dilation site was examined following       endoscope reinsertion and showed mild mucosal disruption. Estimated       blood loss was minimal.      A small hiatal hernia was present. Estimated blood loss: none.      The duodenal bulb and second portion of the duodenum were normal.      No other significant abnormalities were identified in a careful       examination of the stomach. Impression:                - Non-obstructing Schatzki ring. Dilated.                           -  Small hiatal hernia.                           - Normal duodenal bulb and second portion of the                            duodenum.                           - No specimens collected. Moderate Sedation:      Moderate (conscious) sedation was personally administered by an       anesthesia professional. The following parameters were monitored: oxygen       saturation, heart rate, blood pressure, respiratory rate, EKG, adequacy       of pulmonary ventilation, and response to care. Total physician       intraservice time was 11 minutes. Recommendation:           - Patient has a contact number available for                            emergencies. The signs and symptoms of potential                            delayed complications were discussed with the                            patient. Return to normal activities tomorrow.                            Written discharge instructions were provided to the                            patient.                           - Resume previous diet.                           - Continue present medications. Stop ranitidine.                            Begin Protonix 40 mg once daily.                           - No repeat upper endoscopy.                           - Return to GI office in 3 months. Procedure Code(s):        --- Professional ---                           [id number], Esophagogastroduodenoscopy, flexible,                            transoral; diagnostic, including collection of  specimen(s) by brushing or washing, when performed                            (separate procedure)                           43450, Dilation of esophagus, by unguided sound or                            bougie, single or multiple passes Diagnosis Code(s):        --- Professional ---                           K22.2, Esophageal obstruction                           K44.9,  Diaphragmatic hernia without obstruction or                            gangrene                           R13.10, Dysphagia, unspecified                           R12, Heartburn CPT copyright 2017 [employer]. All rights reserved. The codes documented in this report are preliminary and upon coder review may  be revised to meet current compliance requirements. Cristopher Estimable. Sherilyn Windhorst, MD Norvel Richards, MD 04/04/2018 9:01:08 AM This report has been signed electronically. Number of Addenda: 0

## 2018-04-04 NOTE — H&P (Signed)
@LOGO @   Primary Care Physician:  Kathyrn Drown, MD Primary Gastroenterologist:  Dr. Gala Romney  Pre-Procedure History & Physical: HPI:  Rachael Matthews is a 72 y.o. female here for further evaluation of refractory GERD and esophageal dysphagia via EGD.  Past Medical History:  Diagnosis Date  . Depression   . Diverticulosis   . Family history of premature coronary artery disease 04/27/2016  . GERD (gastroesophageal reflux disease)   . Headache(784.0)   . Mixed hyperlipidemia   . MRI of brain abnormal   . Osteoporosis   . Sleep apnea   . Small vessel disease [facility])     Past Surgical History:  Procedure Laterality Date  . ABDOMINAL HYSTERECTOMY    . BLADDER SURGERY     tact  . CERVICAL SPINE SURGERY     removal of tumor upper neck.  . COLONOSCOPY  06/09/2007   YQI:HKVQ papillae otherwise normal  . COLONOSCOPY N/A 12/18/2015   Dr.Elinor Kleine- colonic diverticulosis, melanosis coli, bx= benign, stool sample= negative  . MANDIBLE FRACTURE SURGERY     jaw line reduced    Prior to Admission medications   Medication Sig Start Date End Date Taking? Authorizing Provider  ALPRAZolam (XANAX) 0.25 MG tablet Take 1 tablet (0.25 mg total) by mouth 2 (two) times daily as needed. Patient taking differently: Take 0.25 mg by mouth 2 (two) times daily as needed for anxiety.  12/01/17  Yes Kathyrn Drown, MD  fexofenadine (ALLEGRA ALLERGY) 180 MG tablet Take 180 mg by mouth daily.   Yes [provider]  Lactase (LACTAID PO) Take 1 tablet by mouth daily as needed (for lactose).    Yes [provider]  loperamide (IMODIUM) 2 MG capsule Take 1 mg by mouth as needed for diarrhea or loose stools.    Yes [provider]  Polyethylene Glycol 400 (BLINK TEARS OP) Place 2 drops into both eyes daily.   Yes [provider]  ranitidine (ZANTAC) 150 MG tablet Take 1 tablet (150 mg total) by mouth 2 (two) times daily. 12/01/17  Yes Luking, Elayne Snare, MD  topiramate (TOPAMAX) 25 MG  tablet TAKE 1 TABLET BY MOUTH DAILY Patient taking differently: Take 25 mg by mouth daily.  01/26/18  Yes Kathyrn Drown, MD  zoledronic acid (RECLAST) 5 MG/100ML SOLN injection Inject 5 mg into the vein once. Next dose due in August 2019   Yes [provider]  Alirocumab (PRALUENT) 75 MG/ML SOPN Inject 75 mg every 14 (fourteen) days into the skin. Patient not taking: Reported on 03/21/2018 11/03/17   Kathyrn Drown, MD    Allergies as of 03/01/2018 - Review Complete 03/01/2018  Allergen Reaction Noted  . Codeine  04/28/2013  . Statins  04/28/2013  . Fosamax [alendronate sodium]  07/06/2014    Family History  Problem Relation Age of Onset  . Coronary artery disease Father        Died with MI age 15  . Heart attack Father   . Hypertension Mother   . Heart failure Mother   . Hyperlipidemia Mother   . Osteoporosis Mother   . Other Mother        bowel issues  . Hypertension Sister   . Heart disease Brother   . Hypertension Brother   . Colon cancer Neg Hx     Social History   Socioeconomic History  . Marital status: Married    Spouse name: Not on file  . Number of children: Not on file  .  Years of education: Not on file  . Highest education level: Not on file  Occupational History  . Occupation: Retired    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  Social Needs  . Financial resource strain: Not on file  . Food insecurity:    Worry: Not on file    Inability: Not on file  . Transportation needs:    Medical: Not on file    Non-medical: Not on file  Tobacco Use  . Smoking status: Never Smoker  . Smokeless tobacco: Never Used  Substance and Sexual Activity  . Alcohol use: No  . Drug use: No  . Sexual activity: Yes    Birth control/protection: Surgical  Lifestyle  . Physical activity:    Days per week: Not on file    Minutes per session: Not on file  . Stress: Not on file  Relationships  . Social connections:    Talks on phone: Not on file    Gets  together: Not on file    Attends religious service: Not on file    Active member of club or organization: Not on file    Attends meetings of clubs or organizations: Not on file    Relationship status: Not on file  . Intimate partner violence:    Fear of current or ex partner: Not on file    Emotionally abused: Not on file    Physically abused: Not on file    Forced sexual activity: Not on file  Other Topics Concern  . Not on file  Social History Narrative  . Not on file    Review of Systems: See HPI, otherwise negative ROS  Physical Exam: There were no vitals taken for this visit. General:   Alert,  Well-developed, well-nourished, pleasant and cooperative in NAD Neck:  Supple; no masses or thyromegaly. No significant cervical adenopathy. Lungs:  Clear throughout to auscultation.   No wheezes, crackles, or rhonchi. No acute distress. Heart:  Regular rate and rhythm; no murmurs, clicks, rubs,  or gallops. Abdomen: Non-distended, normal bowel sounds.  Soft and nontender without appreciable mass or hepatosplenomegaly.  Pulses:  Normal pulses noted. Extremities:  Without clubbing or edema.  Impression:  72 year old lady with long-standing GERD and esophageal dysphagia. EGD warranted to further evaluate.  Recommendations:  Patient EGD with esophageal dilation as appropriate as planned today.  The risks, benefits, limitations, alternatives and imponderables have been reviewed with the patient. Potential for esophageal dilation, biopsy, etc. have also been reviewed.  Questions have been answered. All parties agreeable.   Notice: This dictation was prepared with Dragon dictation along with smaller phrase technology. Any transcriptional errors that result from this process are unintentional and may not be corrected upon review.

## 2018-04-04 NOTE — Interval H&P Note (Signed)
History and Physical Interval Note:  04/04/2018 8:31 AM  Cherylann Ratel  has presented today for surgery, with the diagnosis of refractory GERD, dysphagia  The various methods of treatment have been discussed with the patient and family. After consideration of risks, benefits and other options for treatment, the patient has consented to  Procedure(s) with comments: ESOPHAGOGASTRODUODENOSCOPY (EGD) WITH PROPOFOL (N/A) - 8:30am MALONEY DILATION (N/A) as a surgical intervention .  The patient's history has been reviewed, patient examined, no change in status, stable for surgery.  I have reviewed the patient's chart and labs.  Questions were answered to the patient's satisfaction.     No change.  Manus Rudd

## 2018-04-04 NOTE — Addendum Note (Signed)
Addendum  created 04/04/18 1809 by Vista Deck, CRNA   Charge Capture section accepted

## 2018-04-04 NOTE — Discharge Instructions (Signed)
EGD Discharge instructions Please read the instructions outlined below and refer to this sheet in the next few weeks. These discharge instructions provide you with general information on caring for yourself after you leave the hospital. Your doctor may also give you specific instructions. While your treatment has been planned according to the most current medical practices available, unavoidable complications occasionally occur. If you have any problems or questions after discharge, please call your doctor. ACTIVITY  You may resume your regular activity but move at a slower pace for the next 24 hours.   Take frequent rest periods for the next 24 hours.   Walking will help expel (get rid of) the air and reduce the bloated feeling in your abdomen.   No driving for 24 hours (because of the anesthesia (medicine) used during the test).   You may shower.   Do not sign any important legal documents or operate any machinery for 24 hours (because of the anesthesia used during the test).  NUTRITION  Drink plenty of fluids.   You may resume your normal diet.   Begin with a light meal and progress to your normal diet.   Avoid alcoholic beverages for 24 hours or as instructed by your caregiver.  MEDICATIONS  You may resume your normal medications unless your caregiver tells you otherwise.  WHAT YOU CAN EXPECT TODAY  You may experience abdominal discomfort such as a feeling of fullness or gas pains.  FOLLOW-UP  Your doctor will discuss the results of your test with you.  SEEK IMMEDIATE MEDICAL ATTENTION IF ANY OF THE FOLLOWING OCCUR:  Excessive nausea (feeling sick to your stomach) and/or vomiting.   Severe abdominal pain and distention (swelling).   Trouble swallowing.   Temperature over 101 F (37.8 C).   Rectal bleeding or vomiting of blood.    GERD Information provided  Stop ranitidine; begin Protonix 40 mg daily  Office visit with [place] in 3 months.  July 15th at 11:00  am  Gastroesophageal Reflux Disease, Adult Normally, food travels down the esophagus and stays in the stomach to be digested. However, when a person has gastroesophageal reflux disease (GERD), food and stomach acid move back up into the esophagus. When this happens, the esophagus becomes sore and inflamed. Over time, GERD can create small holes (ulcers) in the lining of the esophagus. What are the causes? This condition is caused by a problem with the muscle between the esophagus and the stomach (lower esophageal sphincter, or LES). Normally, the LES muscle closes after food passes through the esophagus to the stomach. When the LES is weakened or abnormal, it does not close properly, and that allows food and stomach acid to go back up into the esophagus. The LES can be weakened by certain dietary substances, medicines, and medical conditions, including:  Tobacco use.  Pregnancy.  Having a hiatal hernia.  Heavy alcohol use.  Certain foods and beverages, such as coffee, chocolate, onions, and peppermint.  What increases the risk? This condition is more likely to develop in:  People who have an increased body weight.  People who have connective tissue disorders.  People who use NSAID medicines.  What are the signs or symptoms? Symptoms of this condition include:  Heartburn.  Difficult or painful swallowing.  The feeling of having a lump in the throat.  Abitter taste in the mouth.  Bad breath.  Having a large amount of saliva.  Having an upset or bloated stomach.  Belching.  Chest pain.  Shortness of breath  or wheezing.  Ongoing (chronic) cough or a night-time cough.  Wearing away of tooth enamel.  Weight loss.  Different conditions can cause chest pain. Make sure to see your health care provider if you experience chest pain. How is this diagnosed? Your health care provider will take a medical history and perform a physical exam. To determine if you have mild or  severe GERD, your health care provider may also monitor how you respond to treatment. You may also have other tests, including:  An endoscopy toexamine your stomach and esophagus with a small camera.  A test thatmeasures the acidity level in your esophagus.  A test thatmeasures how much pressure is on your esophagus.  A barium swallow or modified barium swallow to show the shape, size, and functioning of your esophagus.  How is this treated? The goal of treatment is to help relieve your symptoms and to prevent complications. Treatment for this condition may vary depending on how severe your symptoms are. Your health care provider may recommend:  Changes to your diet.  Medicine.  Surgery.  Follow these instructions at home: Diet  Follow a diet as recommended by your health care provider. This may involve avoiding foods and drinks such as: ? Coffee and tea (with or without caffeine). ? Drinks that containalcohol. ? Energy drinks and sports drinks. ? Carbonated drinks or sodas. ? Chocolate and cocoa. ? Peppermint and mint flavorings. ? Garlic and onions. ? Horseradish. ? Spicy and acidic foods, including peppers, chili powder, curry powder, vinegar, hot sauces, and barbecue sauce. ? Citrus fruit juices and citrus fruits, such as oranges, lemons, and limes. ? Tomato-based foods, such as red sauce, chili, salsa, and pizza with red sauce. ? Fried and fatty foods, such as donuts, french fries, potato chips, and high-fat dressings. ? High-fat meats, such as hot dogs and fatty cuts of red and white meats, such as rib eye steak, sausage, ham, and bacon. ? High-fat dairy items, such as whole milk, butter, and cream cheese.  Eat small, frequent meals instead of large meals.  Avoid drinking large amounts of liquid with your meals.  Avoid eating meals during the 2-3 hours before bedtime.  Avoid lying down right after you eat.  Do not exercise right after you eat. General  instructions  Pay attention to any changes in your symptoms.  Take over-the-counter and prescription medicines only as told by your health care provider. Do not take aspirin, ibuprofen, or other NSAIDs unless your health care provider told you to do so.  Do not use any tobacco products, including cigarettes, chewing tobacco, and e-cigarettes. If you need help quitting, ask your health care provider.  Wear loose-fitting clothing. Do not wear anything tight around your waist that causes pressure on your abdomen.  Raise (elevate) the head of your bed 6 inches (15cm).  Try to reduce your stress, such as with yoga or meditation. If you need help reducing stress, ask your health care provider.  If you are overweight, reduce your weight to an amount that is healthy for you. Ask your health care provider for guidance about a safe weight loss goal.  Keep all follow-up visits as told by your health care provider. This is important. Contact a health care provider if:  You have new symptoms.  You have unexplained weight loss.  You have difficulty swallowing, or it hurts to swallow.  You have wheezing or a persistent cough.  Your symptoms do not improve with treatment.  You have a hoarse  voice. Get help right away if:  You have pain in your arms, neck, jaw, teeth, or back.  You feel sweaty, dizzy, or light-headed.  You have chest pain or shortness of breath.  You vomit and your vomit looks like blood or coffee grounds.  You faint.  Your stool is bloody or black.  You cannot swallow, drink, or eat. This information is not intended to replace advice given to you by your health care provider. Make sure you discuss any questions you have with your health care provider. Document Released: 09/23/2005 Document Revised: 05/13/2016 Document Reviewed: 04/10/2015 Elsevier Interactive Patient Education  [employer].

## 2018-04-04 NOTE — Anesthesia Preprocedure Evaluation (Addendum)
Anesthesia Evaluation  Patient identified by MRN, date of birth, ID band Patient awake    Reviewed: Allergy & Precautions, H&P , NPO status , Patient's Chart, lab work & pertinent test results  Airway Mallampati: II  TM Distance: >3 FB Neck ROM: limited    Dental  (+) Teeth Intact   Pulmonary shortness of breath and with exertion, sleep apnea ,    breath sounds clear to auscultation       Cardiovascular + Valvular Problems/Murmurs  Rhythm:regular Rate:Normal     Neuro/Psych    GI/Hepatic GERD  ,  Endo/Other    Renal/GU      Musculoskeletal   Abdominal   Peds  Hematology   Anesthesia Other Findings   Reproductive/Obstetrics                             Anesthesia Physical Anesthesia Plan  ASA: II  Anesthesia Plan: General   Post-op Pain Management:    Induction:   PONV Risk Score and Plan:   Airway Management Planned:   Additional Equipment:   Intra-op Plan:   Post-operative Plan:   Informed Consent: I have reviewed the patients History and Physical, chart, labs and discussed the procedure including the risks, benefits and alternatives for the proposed anesthesia with the patient or authorized representative who has indicated his/her understanding and acceptance.     Plan Discussed with: CRNA  Anesthesia Plan Comments:        Anesthesia Quick Evaluation

## 2018-04-04 NOTE — Anesthesia Postprocedure Evaluation (Signed)
Anesthesia Post Note  Patient: Rachael Matthews  Procedure(s) Performed: ESOPHAGOGASTRODUODENOSCOPY (EGD) WITH PROPOFOL (N/A ) MALONEY DILATION (N/A )  Patient location during evaluation: PACU Anesthesia Type: General Level of consciousness: awake and alert and patient cooperative Pain management: satisfactory to patient Vital Signs Assessment: post-procedure vital signs reviewed and stable Respiratory status: spontaneous breathing Cardiovascular status: stable Postop Assessment: no apparent nausea or vomiting Anesthetic complications: no     Last Vitals:  Vitals:   04/04/18 0903 04/04/18 0935  BP: (!) 95/58 126/63  Pulse: 76 78  Resp: (!) 23   Temp:  36.4 C  SpO2: 96% 96%    Last Pain:  Vitals:   04/04/18 0935  TempSrc: Oral  PainSc: 5                  Hutson Luft

## 2018-04-05 ENCOUNTER — Telehealth: Payer: Self-pay | Admitting: Internal Medicine

## 2018-04-05 NOTE — Telephone Encounter (Signed)
Pt had a question about her discharge papers yesterday. When pt was released from the hospital, it said she should stop taking Praluent 75/mg/ml. Pt said she gets that injection q 2 weeks for cholesterol. Can you please advise on why pt should d/c so she can understand.

## 2018-04-05 NOTE — Telephone Encounter (Signed)
Pt has questions about her medication. Please call [phone]

## 2018-04-06 NOTE — Telephone Encounter (Signed)
She should continue that medication. Disregard what is said in the discharge summary about stopping it.

## 2018-04-06 NOTE — Telephone Encounter (Signed)
Pt notified and her medication was added back to epic system.

## 2018-04-12 ENCOUNTER — Encounter (HOSPITAL_COMMUNITY): Payer: Self-pay | Admitting: Internal Medicine

## 2018-04-14 DIAGNOSIS — J329 Chronic sinusitis, unspecified: Secondary | ICD-10-CM | POA: Diagnosis not present

## 2018-04-14 DIAGNOSIS — J111 Influenza due to unidentified influenza virus with other respiratory manifestations: Secondary | ICD-10-CM | POA: Diagnosis not present

## 2018-06-06 ENCOUNTER — Other Ambulatory Visit: Payer: Self-pay | Admitting: Family Medicine

## 2018-06-06 NOTE — Telephone Encounter (Signed)
Six mo worth ok 

## 2018-07-01 ENCOUNTER — Other Ambulatory Visit: Payer: Self-pay | Admitting: Family Medicine

## 2018-07-04 NOTE — Telephone Encounter (Signed)
Left message to return call 

## 2018-07-07 ENCOUNTER — Other Ambulatory Visit: Payer: Self-pay | Admitting: Nurse Practitioner

## 2018-07-11 ENCOUNTER — Ambulatory Visit: Payer: Medicare HMO | Admitting: Gastroenterology

## 2018-07-11 ENCOUNTER — Encounter: Payer: Self-pay | Admitting: Gastroenterology

## 2018-07-11 VITALS — BP 126/79 | HR 73 | Temp 97.7°F | Ht 60.0 in | Wt 134.2 lb

## 2018-07-11 DIAGNOSIS — K219 Gastro-esophageal reflux disease without esophagitis: Secondary | ICD-10-CM | POA: Diagnosis not present

## 2018-07-11 NOTE — Assessment & Plan Note (Signed)
Clinically doing much better on pantoprazole 40 mg daily.  She has been on medication now for 3 months.  She voices concerns regarding osteoporosis and long-term PPI use.  Reinforced antireflux measures.  Currently she is doing very well and does not want to change her regimen but in the future she wants to consider weaning off of pantoprazole and trying ranitidine.  If she decides to do this we discussed weaning measures, we also discussed potential tachyphylaxis with ranitidine and taking a weeklong drug holiday if she loses efficacy.  She voiced understanding.  She will come back on a as needed basis per her request.  If she continues pantoprazole and requires prescriptions we will see her every 2 years.

## 2018-07-11 NOTE — Progress Notes (Signed)
Primary Care Physician: Kathyrn Drown, MD  Primary Gastroenterologist:  Garfield Cornea, MD   Chief Complaint  Patient presents with  . Gastroesophageal Reflux    not as much    HPI: Rachael Matthews is a 72 y.o. female here for follow-up of acid reflux.  She was seen back in March for worsening reflux symptoms for several months.  Also with vague intermittent dysphagia.  Symptoms failed to respond to ranitidine.  She states in retrospect she thought she was on a higher dose (300 mg) but she discovered she was on 150 mg, she had 2 different prescriptions at the pharmacy.  She wonders if the 300 mg dose for the been more efficacious.  EGD was done and she was found to have nonobstructing Schatzki ring which was dilated, small hiatal hernia.  She was switched from ranitidine to pantoprazole 40 mg daily.  Next  Clinically she is doing much better.  Her reflux is much better controlled.  Epigastric discomfort has basically subsided but it took a while.  She voices concerns regarding long-term PPI use as her PCP has tried to limit her exposure in the setting of osteoporosis.  She denies any ongoing dysphagia.  Appetite is good.  Bowel function is stable.  No melena or rectal bleeding.  Current Outpatient Medications  Medication Sig Dispense Refill  . ALPRAZolam (XANAX) 0.25 MG tablet TAKE 1 TABLET BY MOUTH TWICE DAILY AS NEEDED 30 tablet 5  . fexofenadine (ALLEGRA ALLERGY) 180 MG tablet Take 180 mg by mouth daily.    . Lactase (LACTAID PO) Take 1 tablet by mouth daily as needed (for lactose).     Marland Kitchen loperamide (IMODIUM) 2 MG capsule Take 1 mg by mouth as needed for diarrhea or loose stools.     . pantoprazole (PROTONIX) 40 MG tablet Take 40 mg by mouth daily.    . Polyethylene Glycol 400 (BLINK TEARS OP) Place 2 drops into both eyes daily.    Marland Kitchen topiramate (TOPAMAX) 25 MG tablet TAKE 1 TABLET BY MOUTH DAILY 90 tablet 0  . zoledronic acid (RECLAST) 5 MG/100ML SOLN injection Inject 5 mg into  the vein once. Next dose due in August 2019     No current facility-administered medications for this visit.     Allergies as of 07/11/2018 - Review Complete 07/11/2018  Allergen Reaction Noted  . Codeine Other (See Comments) 04/28/2013  . Statins Other (See Comments) 04/28/2013  . Fosamax [alendronate sodium] Other (See Comments) 07/06/2014    ROS:  General: Negative for anorexia, weight loss, fever, chills, fatigue, weakness. ENT: Negative for hoarseness, difficulty swallowing , nasal congestion. CV: Negative for chest pain, angina, palpitations, dyspnea on exertion, peripheral edema.  Respiratory: Negative for dyspnea at rest, dyspnea on exertion, cough, sputum, wheezing.  GI: See history of present illness. GU:  Negative for dysuria, hematuria, urinary incontinence, urinary frequency, nocturnal urination.  Endo: Negative for unusual weight change.    Physical Examination:   BP 126/79   Pulse 73   Temp 97.7 F (36.5 C) (Oral)   Ht 5' (1.524 m)   Wt 134 lb 3.2 oz (60.9 kg)   BMI 26.21 kg/m   General: Well-nourished, well-developed in no acute distress.  Eyes: No icterus. Mouth: Oropharyngeal mucosa moist and pink , no lesions erythema or exudate. Lungs: Clear to auscultation bilaterally.  Heart: Regular rate and rhythm, no murmurs rubs or gallops.  Abdomen: Bowel sounds are normal, nontender, nondistended, no hepatosplenomegaly or masses, no  abdominal bruits or hernia , no rebound or guarding.   Extremities: No lower extremity edema. No clubbing or deformities. Neuro: Alert and oriented x 4   Skin: Warm and dry, no jaundice.   Psych: Alert and cooperative, normal mood and affect.  Labs:  Lab Results  Component Value Date   CREATININE 0.75 03/28/2018   BUN 13 03/28/2018   NA 138 03/28/2018   K 3.8 03/28/2018   CL 105 03/28/2018   CO2 24 03/28/2018   Lab Results  Component Value Date   WBC 7.5 03/28/2018   HGB 14.0 03/28/2018   HCT 43.3 03/28/2018   MCV 90.6  03/28/2018   PLT 269 03/28/2018    Imaging Studies: No results found.

## 2018-07-11 NOTE — Progress Notes (Signed)
cc'ed to pcp °

## 2018-07-11 NOTE — Patient Instructions (Signed)
Continue pantoprazole once daily for now. If/when you decide to wean off, start with every other day for a week, then every 2 days for a week, etc until you are off.  If you have recurrent heartburn or stomach burning, you may have to go back on pantoprazole or at least an acid reducer like ranitidine 150mg  twice daily.   Call if you have any questions or concerns!

## 2018-07-29 ENCOUNTER — Other Ambulatory Visit: Payer: Self-pay | Admitting: Family Medicine

## 2018-07-29 ENCOUNTER — Telehealth: Payer: Self-pay | Admitting: Family Medicine

## 2018-07-29 DIAGNOSIS — Z0000 Encounter for general adult medical examination without abnormal findings: Secondary | ICD-10-CM

## 2018-07-29 DIAGNOSIS — E782 Mixed hyperlipidemia: Secondary | ICD-10-CM

## 2018-07-29 DIAGNOSIS — Z1159 Encounter for screening for other viral diseases: Secondary | ICD-10-CM

## 2018-07-29 DIAGNOSIS — Z79899 Other long term (current) drug therapy: Secondary | ICD-10-CM

## 2018-07-29 NOTE — Telephone Encounter (Signed)
Left message to return call 

## 2018-07-29 NOTE — Telephone Encounter (Signed)
Lipid, liver, metabolic 7, hepatitis C antibody

## 2018-07-29 NOTE — Telephone Encounter (Signed)
Pt calling to schedule her 6 month follow up. Before scheduling it she would like to know if Dr. Nicki Reaper would like lab work ordered.

## 2018-07-29 NOTE — Telephone Encounter (Signed)
Last labs from 11/11/17 BMP Lipid and hepatic

## 2018-08-03 NOTE — Telephone Encounter (Signed)
Pt.notified

## 2018-08-03 NOTE — Telephone Encounter (Signed)
Also orders were put in on 8/2.

## 2018-08-06 DIAGNOSIS — Z0000 Encounter for general adult medical examination without abnormal findings: Secondary | ICD-10-CM | POA: Diagnosis not present

## 2018-08-06 DIAGNOSIS — Z1159 Encounter for screening for other viral diseases: Secondary | ICD-10-CM | POA: Diagnosis not present

## 2018-08-06 DIAGNOSIS — E782 Mixed hyperlipidemia: Secondary | ICD-10-CM | POA: Diagnosis not present

## 2018-08-06 DIAGNOSIS — Z79899 Other long term (current) drug therapy: Secondary | ICD-10-CM | POA: Diagnosis not present

## 2018-08-08 ENCOUNTER — Telehealth: Payer: Self-pay | Admitting: Family Medicine

## 2018-08-08 LAB — BASIC METABOLIC PANEL
BUN / CREAT RATIO: 19 (ref 12–28)
BUN: 15 mg/dL (ref 8–27)
CO2: 22 mmol/L (ref 20–29)
Calcium: 9 mg/dL (ref 8.7–10.3)
Chloride: 106 mmol/L (ref 96–106)
Creatinine, Ser: 0.79 mg/dL (ref 0.57–1.00)
GFR calc Af Amer: 86 mL/min/{1.73_m2} (ref >59)
GFR calc non Af Amer: 74 mL/min/{1.73_m2} (ref >59)
Glucose: 85 mg/dL (ref 65–99)
Potassium: 4.3 mmol/L (ref 3.5–5.2)
Sodium: 142 mmol/L (ref 134–144)

## 2018-08-08 LAB — HEPATIC FUNCTION PANEL
ALK PHOS: 46 IU/L (ref 39–117)
ALT: 12 IU/L (ref 0–32)
AST: 18 IU/L (ref 0–40)
Albumin: 4.1 g/dL (ref 3.5–4.8)
BILIRUBIN, DIRECT: 0.1 mg/dL (ref 0.00–0.40)
Bilirubin Total: 0.4 mg/dL (ref 0.0–1.2)
TOTAL PROTEIN: 6.6 g/dL (ref 6.0–8.5)

## 2018-08-08 LAB — LIPID PANEL
CHOLESTEROL TOTAL: 303 mg/dL — AB (ref 100–199)
Chol/HDL Ratio: 7 ratio — ABNORMAL HIGH (ref 0.0–4.4)
HDL: 43 mg/dL (ref >39)
LDL CALC: 227 mg/dL — AB (ref 0–99)
Triglycerides: 167 mg/dL — ABNORMAL HIGH (ref 0–149)
VLDL Cholesterol Cal: 33 mg/dL (ref 5–40)

## 2018-08-08 LAB — HEPATITIS C ANTIBODY: Hep C Virus Ab: <0.1 s/co ratio (ref 0.0–0.9)

## 2018-08-08 NOTE — Telephone Encounter (Signed)
Patient is scheduled to have an injection this Friday at [facility] for her osteoporosis.  She wants to know if Dr. Nicki Reaper still wants her to have this done?

## 2018-08-08 NOTE — Telephone Encounter (Signed)
Based on her bone density and her health history yes I recommend that she complete this

## 2018-08-08 NOTE — Telephone Encounter (Signed)
Patient is aware 

## 2018-08-08 NOTE — Telephone Encounter (Signed)
Pt's sched for Friday at [facility] for reclast and labs.Pt would like to know if you still would like for her to have this done.

## 2018-08-09 ENCOUNTER — Telehealth: Payer: Self-pay | Admitting: Family Medicine

## 2018-08-09 NOTE — Telephone Encounter (Signed)
Pt just received a call from the hospital stating they do not have the order for her infusion on Friday. Also they told her we needed to verify with insurance that it would be covered.

## 2018-08-10 NOTE — Telephone Encounter (Signed)
Please complete highlighted area so I may send order to Short Stay Pt is scheduled for Friday 08/12/18  In red folder in yellow box

## 2018-08-10 NOTE — Telephone Encounter (Signed)
This area was completed as required Nurses please inform the patient she should be taking at least 800 units of vitamin D daily and 1200 mg of calcium daily so that her IV Reclast will have the adequate nutrients to help with bone building

## 2018-08-11 NOTE — Telephone Encounter (Signed)
Patient notified Dr Nicki Reaper advises patient should be taking at least 800 units of vitamin D daily and 1200 mg of calcium daily so that her IV Reclast will have the adequate nutrients to help with bone building. Patient verbalized understanding.

## 2018-08-12 ENCOUNTER — Encounter (HOSPITAL_COMMUNITY)
Admission: RE | Admit: 2018-08-12 | Discharge: 2018-08-12 | Disposition: A | Discharge status: Home / Self Care | Payer: Medicare HMO | Source: Ambulatory Visit | Attending: Family Medicine | Admitting: Family Medicine

## 2018-08-12 DIAGNOSIS — M810 Age-related osteoporosis without current pathological fracture: Secondary | ICD-10-CM | POA: Diagnosis not present

## 2018-08-12 MED ORDER — SODIUM CHLORIDE 0.9 % IV SOLN
Freq: Once | INTRAVENOUS | Status: AC
  Administered 2018-08-12: 250 mL via INTRAVENOUS

## 2018-08-12 MED ORDER — ZOLEDRONIC ACID 5 MG/100ML IV SOLN
INTRAVENOUS | Status: AC
  Filled 2018-08-12: qty 100

## 2018-08-12 MED ORDER — ZOLEDRONIC ACID 5 MG/100ML IV SOLN
5.0000 mg | Freq: Once | INTRAVENOUS | Status: AC
  Administered 2018-08-12: 5 mg via INTRAVENOUS

## 2018-09-06 ENCOUNTER — Ambulatory Visit (HOSPITAL_COMMUNITY)
Admission: RE | Admit: 2018-09-06 | Discharge: 2018-09-06 | Disposition: A | Discharge status: Home / Self Care | Payer: Medicare HMO | Source: Ambulatory Visit | Attending: Family Medicine | Admitting: Family Medicine

## 2018-09-06 ENCOUNTER — Encounter: Payer: Self-pay | Admitting: Family Medicine

## 2018-09-06 ENCOUNTER — Ambulatory Visit: Payer: Medicare HMO | Admitting: Family Medicine

## 2018-09-06 VITALS — BP 133/88 | Ht 60.0 in | Wt 133.0 lb

## 2018-09-06 DIAGNOSIS — R600 Localized edema: Secondary | ICD-10-CM | POA: Diagnosis not present

## 2018-09-06 DIAGNOSIS — M7989 Other specified soft tissue disorders: Secondary | ICD-10-CM | POA: Diagnosis not present

## 2018-09-06 DIAGNOSIS — K219 Gastro-esophageal reflux disease without esophagitis: Secondary | ICD-10-CM | POA: Diagnosis not present

## 2018-09-06 DIAGNOSIS — R5383 Other fatigue: Secondary | ICD-10-CM | POA: Diagnosis not present

## 2018-09-06 DIAGNOSIS — M810 Age-related osteoporosis without current pathological fracture: Secondary | ICD-10-CM | POA: Diagnosis not present

## 2018-09-06 DIAGNOSIS — M79606 Pain in leg, unspecified: Secondary | ICD-10-CM | POA: Diagnosis not present

## 2018-09-06 DIAGNOSIS — E782 Mixed hyperlipidemia: Secondary | ICD-10-CM | POA: Diagnosis not present

## 2018-09-06 DIAGNOSIS — M79605 Pain in left leg: Secondary | ICD-10-CM | POA: Diagnosis not present

## 2018-09-06 MED ORDER — RANITIDINE HCL 300 MG PO TABS: 300.0000 mg | ORAL_TABLET | Freq: Every day | ORAL | 0 refills | Status: DC

## 2018-09-06 MED ORDER — ZOSTER VAC RECOMB ADJUVANTED 50 MCG/0.5ML IM SUSR: 0.5000 mL | Freq: Once | INTRAMUSCULAR | 1 refills | Status: AC

## 2018-09-06 NOTE — Patient Instructions (Signed)

## 2018-09-06 NOTE — Progress Notes (Signed)
Subjective:    Patient ID: Rachael Matthews, female    DOB: [birth year], 72 y.o.   MRN: [id number]  HPI This is a very nice patient She comes in with a significant amount of health issues This took a substantial amount of time to work through all of these issues Please see documentation below Patient has osteoporosis.  She is on injections.  She wonders if she needs to make any adjustments Patient had bone density in January 2018 she will be due in January 2020 she will let us know when the time comes we will help set this up  Patient is here today to follow up on her chronic illnesses.She eats healthy and exercises by walking a mile per day.   Patient relates a lot of fatigue tiredness feeling rundown denies melena denies vomiting denies chest tightness pressure or pain.  Hard to know what is causing her fatigue she denies any sleep apnea symptoms  Hyperlipidemia unable to tolerate statins.  Does have known cerebral vascular disease and previous history of stroke It is in her best interest to get this under control We will see if we can get the injectable medicine at one time she was on this it was doing a good job for her Hard to know if we can help her because of Medicare status often makes it difficult to get the drug companies to help out  She wants to discuss her Protonix whether she needs to switch over to zantac due to her osteoporosis.  The patient relates she is concerned that the Protonix could cause increased osteoporosis we have discussed this before we will try ranitidine she will use Protonix only for breakthrough problems  Patient relates lower leg pain discomfort swelling present over the past week since being on a mountain trip in the car a lot.    Review of Systems  Constitutional: Negative for activity change, fatigue and fever.  HENT: Negative for congestion and rhinorrhea.   Respiratory: Negative for cough, chest tightness and shortness of breath.   Cardiovascular:  Negative for chest pain and leg swelling.  Gastrointestinal: Negative for abdominal pain and nausea.  Skin: Negative for color change.  Neurological: Negative for dizziness and headaches.  Psychiatric/Behavioral: Negative for agitation and behavioral problems.       Objective:   Physical Exam  Constitutional: She appears well-developed and well-nourished. No distress.  HENT:  Head: Normocephalic and atraumatic.  Eyes: Right eye exhibits no discharge. Left eye exhibits no discharge.  Neck: No tracheal deviation present.  Cardiovascular: Normal rate, regular rhythm and normal heart sounds.  No murmur heard. Pulmonary/Chest: Effort normal and breath sounds normal. No respiratory distress. She has no wheezes. She has no rales.  Musculoskeletal: She exhibits no edema.  Lymphadenopathy:    She has no cervical adenopathy.  Neurological: She is alert. She exhibits normal muscle tone.  Skin: Skin is warm and dry. No erythema.  Psychiatric: Her behavior is normal.  Vitals reviewed.  Subjective discomfort in the back of the left leg some swelling in the foot knee is normal ankle normal       Assessment & Plan:  osteop-osteoporosis- this patient should go ahead and continue on her current medication Bone density every 2 years is reasonable Watch diet closely  Fatigue-significant fatigue check CBC look at thyroid function await the results do the best she cannot stay physically active hard to tell if this is related to underlying medical condition versus deconditioning versus other issues  Lipid/cost-cannot  tolerate statins.  Currently cannot afford the injectables we will check with the cardiology lipid clinic to see if they are able to help Medicare patients get their medications if so either we can try to do this or possibly have to refer the patient to that clinic  Left leg pain /mountains/travel-patient with recent travel some swelling in the foot along with pain we will check a  d-dimer also do a stat ultrasound to rule out a DVT   GERD-reflux under good control tried Zantac on a regular basis.  Use the other medication only when necessary.  40 minutes spent with the patient greater than half was in discussion of multiple health issues and current care and coordination of her health problems plus also stat labs follow-up and discussion with the patient via phone  Follow-up 6 months

## 2018-09-07 LAB — CBC WITH DIFFERENTIAL/PLATELET
BASOS: 1 %
Basophils Absolute: 0.1 10*3/uL (ref 0.0–0.2)
EOS (ABSOLUTE): 0.3 10*3/uL (ref 0.0–0.4)
Eos: 4 %
Hematocrit: 41.5 % (ref 34.0–46.6)
Hemoglobin: 14.1 g/dL (ref 11.1–15.9)
IMMATURE GRANS (ABS): 0 10*3/uL (ref 0.0–0.1)
IMMATURE GRANULOCYTES: 0 %
Lymphocytes Absolute: 3 10*3/uL (ref 0.7–3.1)
Lymphs: 37 %
MCH: 29.9 pg (ref 26.6–33.0)
MCHC: 34 g/dL (ref 31.5–35.7)
MCV: 88 fL (ref 79–97)
MONOS ABS: 0.8 10*3/uL (ref 0.1–0.9)
Monocytes: 10 %
NEUTROS PCT: 48 %
Neutrophils Absolute: 3.9 10*3/uL (ref 1.4–7.0)
PLATELETS: 342 10*3/uL (ref 150–450)
RBC: 4.72 x10E6/uL (ref 3.77–5.28)
RDW: 13.7 % (ref 12.3–15.4)
WBC: 8.1 10*3/uL (ref 3.4–10.8)

## 2018-09-07 LAB — TSH: TSH: 2.74 u[IU]/mL (ref 0.450–4.500)

## 2018-09-07 LAB — T4, FREE: FREE T4: 1.11 ng/dL (ref 0.82–1.77)

## 2018-09-07 LAB — D-DIMER, QUANTITATIVE: D-DIMER: 0.44 mg/L FEU (ref 0.00–0.49)

## 2018-09-13 DIAGNOSIS — L280 Lichen simplex chronicus: Secondary | ICD-10-CM | POA: Diagnosis not present

## 2018-09-13 DIAGNOSIS — D485 Neoplasm of uncertain behavior of skin: Secondary | ICD-10-CM | POA: Diagnosis not present

## 2018-09-13 DIAGNOSIS — L570 Actinic keratosis: Secondary | ICD-10-CM | POA: Diagnosis not present

## 2018-09-13 DIAGNOSIS — D1801 Hemangioma of skin and subcutaneous tissue: Secondary | ICD-10-CM | POA: Diagnosis not present

## 2018-09-13 DIAGNOSIS — D225 Melanocytic nevi of trunk: Secondary | ICD-10-CM | POA: Diagnosis not present

## 2018-09-13 DIAGNOSIS — L905 Scar conditions and fibrosis of skin: Secondary | ICD-10-CM | POA: Diagnosis not present

## 2018-09-13 DIAGNOSIS — D2271 Melanocytic nevi of right lower limb, including hip: Secondary | ICD-10-CM | POA: Diagnosis not present

## 2018-11-08 ENCOUNTER — Other Ambulatory Visit: Payer: Self-pay | Admitting: Family Medicine

## 2018-11-29 ENCOUNTER — Ambulatory Visit (INDEPENDENT_AMBULATORY_CARE_PROVIDER_SITE_OTHER): Payer: Medicare HMO

## 2018-11-29 DIAGNOSIS — Z23 Encounter for immunization: Secondary | ICD-10-CM | POA: Diagnosis not present

## 2018-12-04 ENCOUNTER — Other Ambulatory Visit: Payer: Self-pay | Admitting: Family Medicine

## 2018-12-05 ENCOUNTER — Other Ambulatory Visit: Payer: Self-pay | Admitting: Family Medicine

## 2018-12-05 MED ORDER — FAMOTIDINE 40 MG PO TABS: 40.0000 mg | ORAL_TABLET | Freq: Every day | ORAL | 1 refills | Status: DC

## 2018-12-05 NOTE — Telephone Encounter (Signed)
Not all ranitidine was recalled I would recommend stopping ranitidine I would recommend that the patient consider switching this to Pepcid 40 mg 1 daily, #90, 1 refill

## 2019-02-22 DIAGNOSIS — Z1231 Encounter for screening mammogram for malignant neoplasm of breast: Secondary | ICD-10-CM | POA: Diagnosis not present

## 2019-02-24 DIAGNOSIS — J029 Acute pharyngitis, unspecified: Secondary | ICD-10-CM | POA: Diagnosis not present

## 2019-02-24 DIAGNOSIS — J111 Influenza due to unidentified influenza virus with other respiratory manifestations: Secondary | ICD-10-CM | POA: Diagnosis not present

## 2019-05-30 DIAGNOSIS — H04123 Dry eye syndrome of bilateral lacrimal glands: Secondary | ICD-10-CM | POA: Diagnosis not present

## 2019-05-30 DIAGNOSIS — H11823 Conjunctivochalasis, bilateral: Secondary | ICD-10-CM | POA: Diagnosis not present

## 2019-05-30 DIAGNOSIS — H57813 Brow ptosis, bilateral: Secondary | ICD-10-CM | POA: Diagnosis not present

## 2019-06-07 ENCOUNTER — Other Ambulatory Visit: Payer: Self-pay | Admitting: Family Medicine

## 2019-08-14 ENCOUNTER — Encounter (HOSPITAL_COMMUNITY): Payer: Medicare HMO

## 2019-08-14 ENCOUNTER — Other Ambulatory Visit (HOSPITAL_COMMUNITY): Payer: Medicare HMO

## 2019-09-09 IMAGING — US US EXTREM LOW VENOUS*L*
1 series · 14 of 24 positions shown · non-contrast
Comparison: None

CLINICAL DATA: Swelling

EXAM:
LEFT LOWER EXTREMITY VENOUS DOPPLER ULTRASOUND
TECHNIQUE: Gray-scale sonography with compression, as well as color and duplex
ultrasound, were performed to evaluate the deep venous system from
the level of the common femoral vein through the popliteal and
proximal calf veins.

[Series 1: us extrem low venous*left* · 0.08mm/px · 14 of 41 slices shown]
[im 1/41]
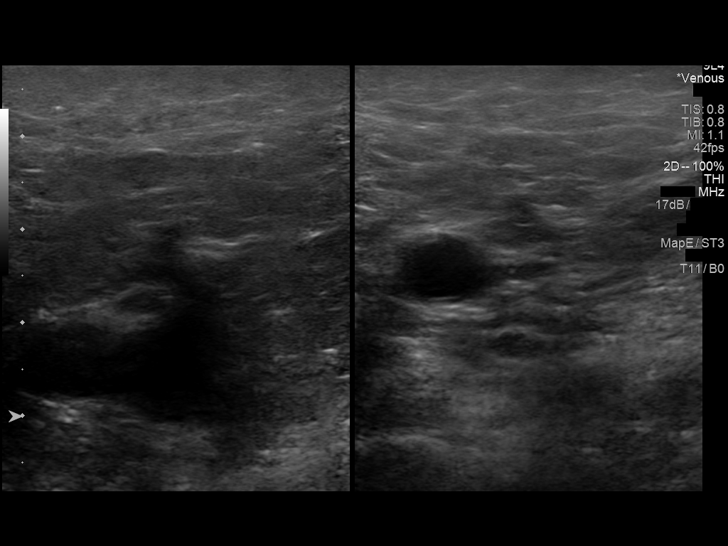
[im 4/41]
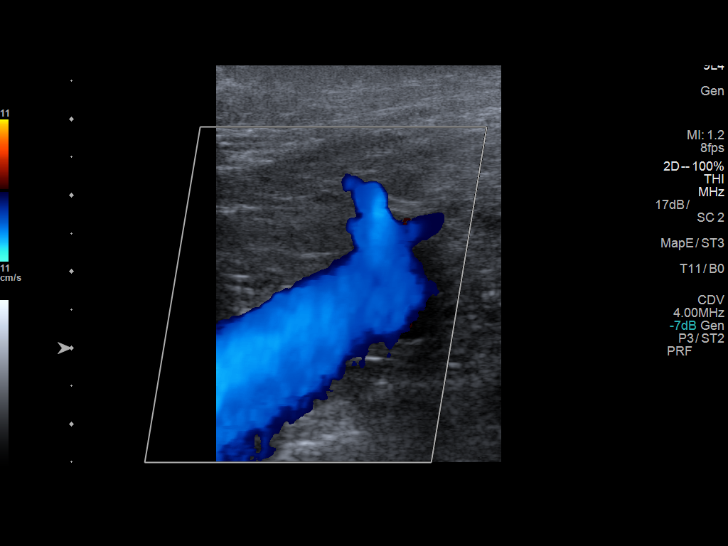
[im 7/41]
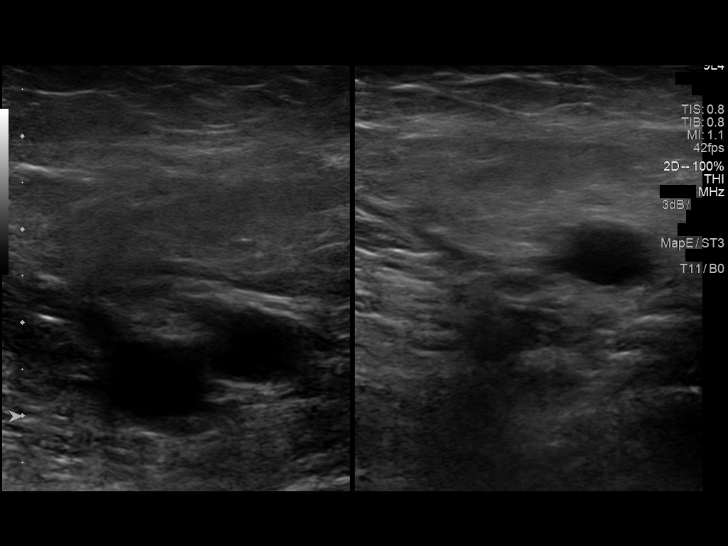
[im 11/41]
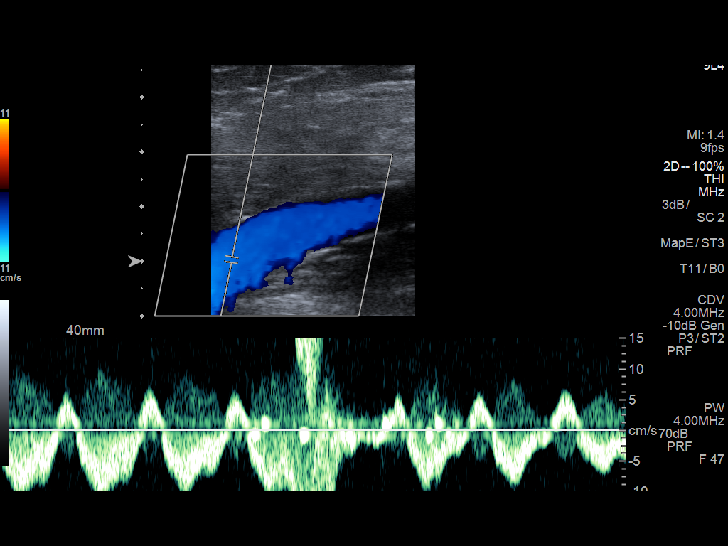
[im 13/41]
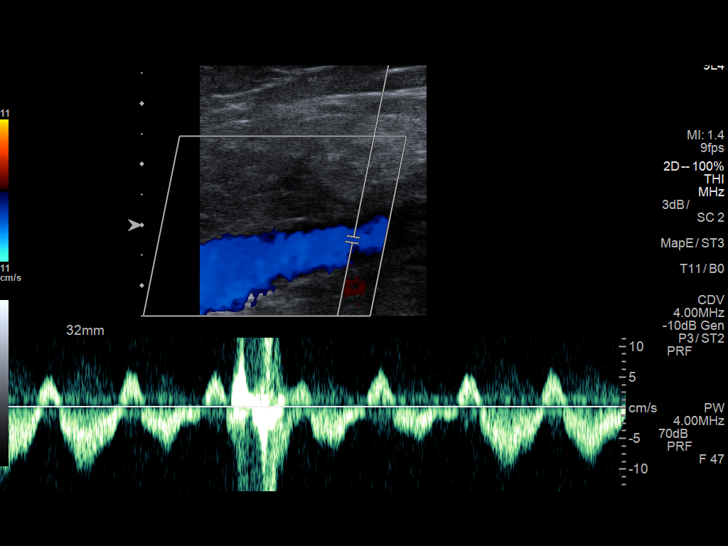
[im 16/41]
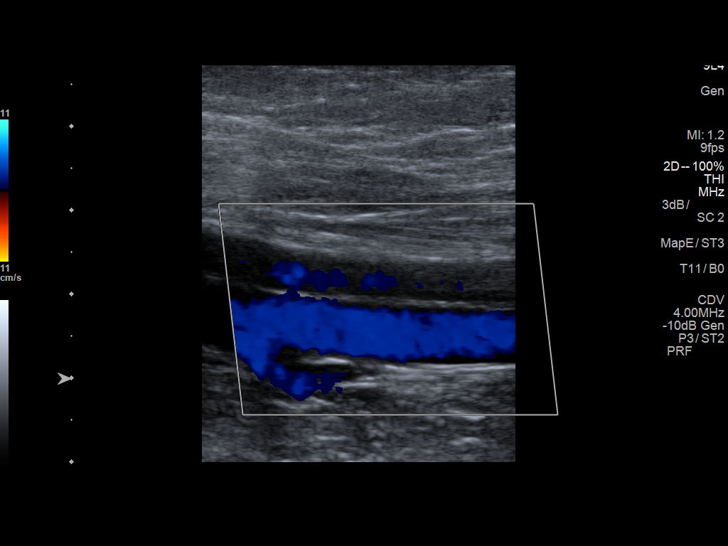
[im 20/41]
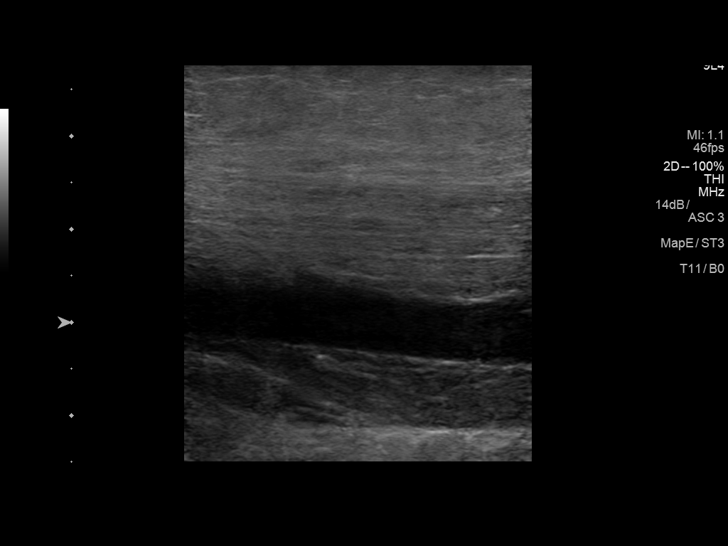
[im 21/41]
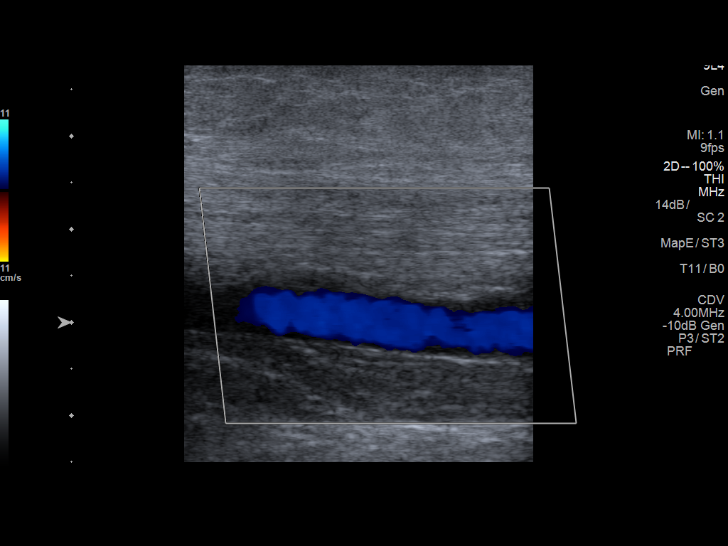
[im 25/41]
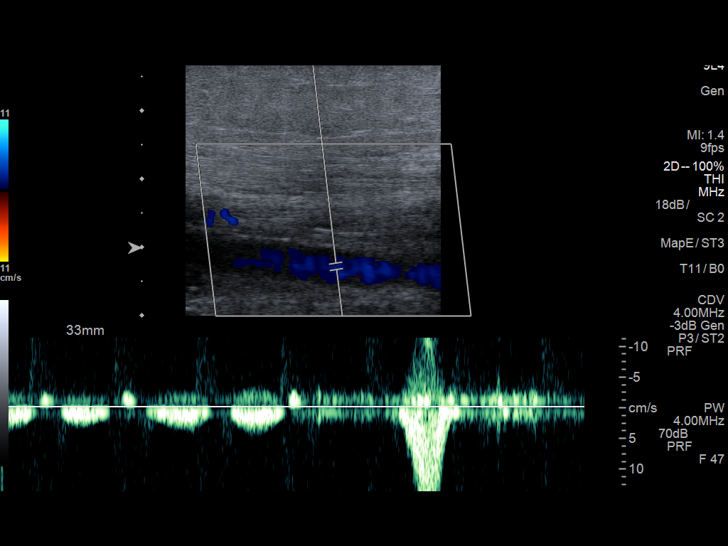
[im 28/41]
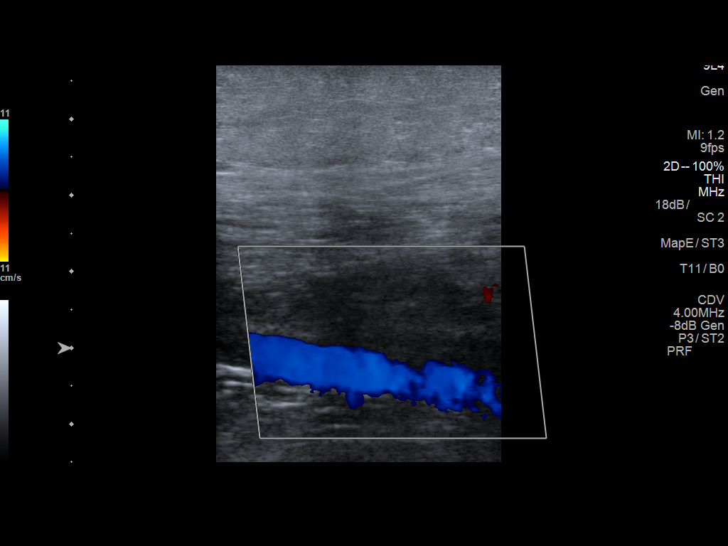
[im 32/41]
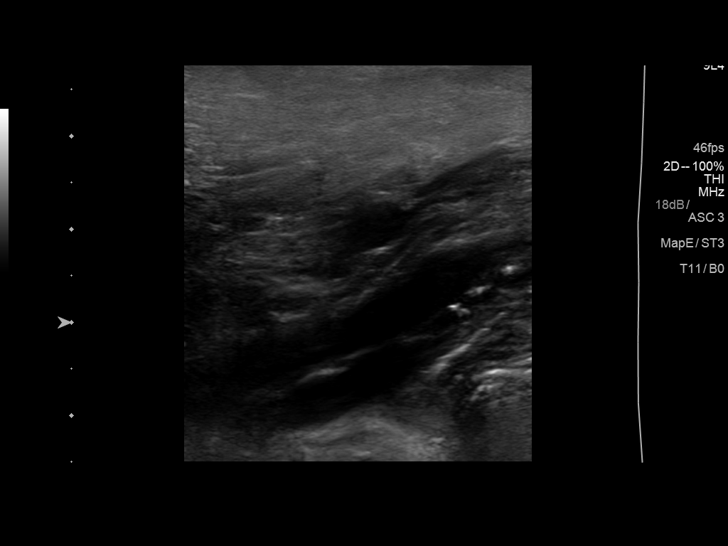
[im 34/41]
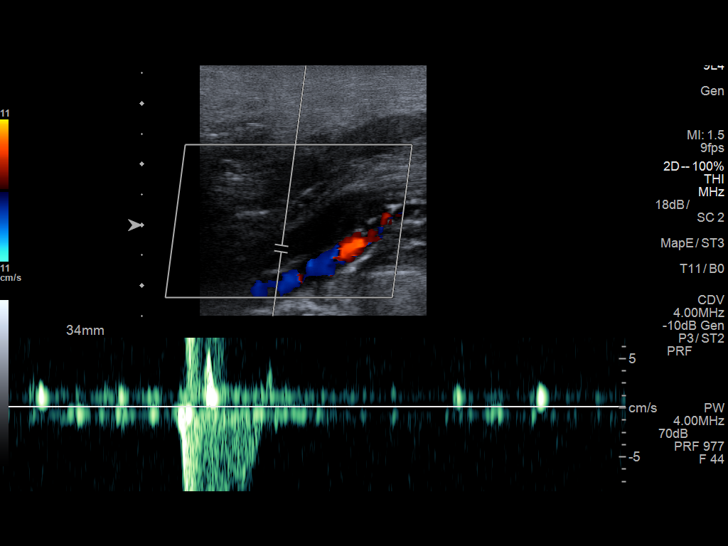
[im 37/41]
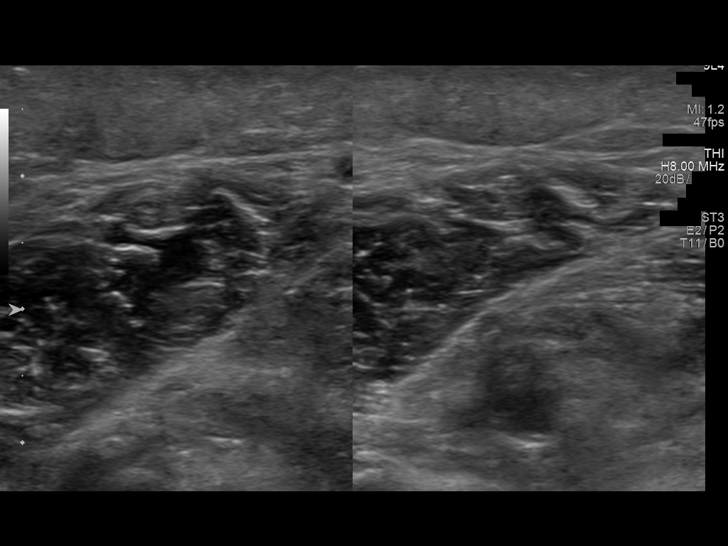
[im 41/41]
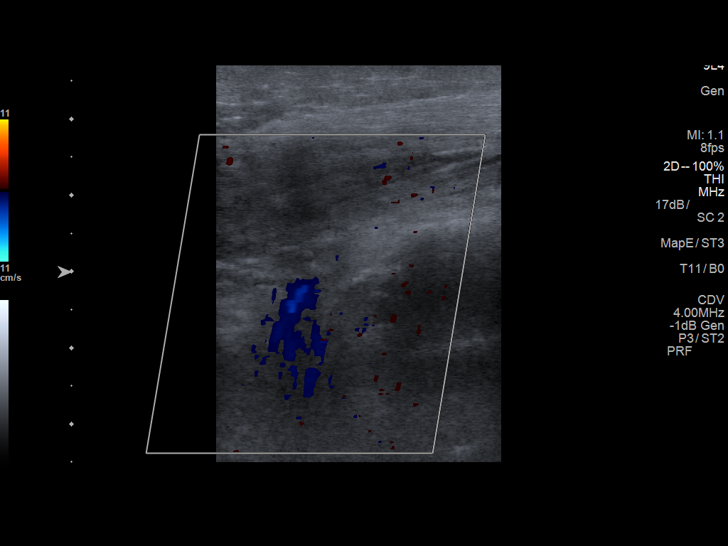

[14 of 24 positions shown; findings below may reference images not displayed]

FINDINGS: Normal compressibility of the common femoral, superficial femoral,
and popliteal veins, as well as the proximal calf veins. No filling
defects to suggest DVT on grayscale or color Doppler imaging.
Doppler waveforms show normal direction of venous flow, normal
respiratory phasicity and response to augmentation. Survey views of
the contralateral common femoral vein are unremarkable.
IMPRESSION: No evidence of left lower extremity deep vein thrombosis.

## 2019-09-12 DIAGNOSIS — L819 Disorder of pigmentation, unspecified: Secondary | ICD-10-CM | POA: Diagnosis not present

## 2019-09-12 DIAGNOSIS — L280 Lichen simplex chronicus: Secondary | ICD-10-CM | POA: Diagnosis not present

## 2019-09-12 DIAGNOSIS — L570 Actinic keratosis: Secondary | ICD-10-CM | POA: Diagnosis not present

## 2019-09-18 ENCOUNTER — Other Ambulatory Visit: Payer: Self-pay | Admitting: Family Medicine

## 2019-09-18 NOTE — Telephone Encounter (Signed)
Recommend virtual visit given her age May have 30-day with 1 refill

## 2019-09-18 NOTE — Telephone Encounter (Signed)
May have 90-day but does need a follow-up office visit This can be virtual

## 2019-09-19 NOTE — Telephone Encounter (Signed)
Please schedule and then route back to nurses 

## 2019-09-20 NOTE — Telephone Encounter (Signed)
Patient has already got this prescription but another see needs filled.Has appointment on 10/15 for medication followup

## 2019-09-20 NOTE — Telephone Encounter (Signed)
Left message to schedule appointment

## 2019-10-12 ENCOUNTER — Ambulatory Visit (INDEPENDENT_AMBULATORY_CARE_PROVIDER_SITE_OTHER): Payer: Medicare HMO | Admitting: Family Medicine

## 2019-10-12 DIAGNOSIS — G43009 Migraine without aura, not intractable, without status migrainosus: Secondary | ICD-10-CM

## 2019-10-12 DIAGNOSIS — K219 Gastro-esophageal reflux disease without esophagitis: Secondary | ICD-10-CM | POA: Diagnosis not present

## 2019-10-12 DIAGNOSIS — E782 Mixed hyperlipidemia: Secondary | ICD-10-CM | POA: Diagnosis not present

## 2019-10-12 MED ORDER — TOPIRAMATE 25 MG PO TABS: ORAL_TABLET | ORAL | 5 refills | Status: DC

## 2019-10-12 MED ORDER — FAMOTIDINE 40 MG PO TABS: ORAL_TABLET | ORAL | 1 refills | Status: DC

## 2019-10-12 NOTE — Progress Notes (Signed)
   Subjective:    Patient ID: Rachael Matthews, female    DOB: 06/07/1946, 73 y.o.   MRN: [id number] Virtual visit HPI Pt is needing refill on Pepcid 40 mg and Toprimate 25 mg. Pt states she is not taking Xanax 0.25 mg any longer.  Patient states her moods overall are doing good. She denies being depressed. She does get some intermittent reflux related symptoms but she states the Pepcid is doing a good job on that  As for her headaches they get worse during [place] from sudden weather systems she did not tolerate high doses of topiramate but she states that the low-dose does good on good weather days.  She describes a headache as well about dizziness nausea and throbbing no vomiting Pt states her migraines get worse when weather systems come in.   Virtual Visit via Telephone Note  I connected with Rachael Matthews on 10/12/19 at  9:00 AM EDT by telephone and verified that I am speaking with the correct person using two identifiers.  Location: Patient: home Provider: office   I discussed the limitations, risks, security and privacy concerns of performing an evaluation and management service by telephone and the availability of in person appointments. I also discussed with the patient that there may be a patient responsible charge related to this service. The patient expressed understanding and agreed to proceed.   History of Present Illness:    Observations/Objective:   Assessment and Plan:   Follow Up Instructions:    I discussed the assessment and treatment plan with the patient. The patient was provided an opportunity to ask questions and all were answered. The patient agreed with the plan and demonstrated an understanding of the instructions.   The patient was advised to call back or seek an in-person evaluation if the symptoms worsen or if the condition fails to improve as anticipated.  I provided 17 minutes of non-face-to-face time during this encounter.   Vicente Males, LPN    Review of Systems  Constitutional: Negative for activity change, appetite change and fatigue.  HENT: Negative for congestion and rhinorrhea.   Respiratory: Negative for cough and shortness of breath.   Cardiovascular: Negative for chest pain and leg swelling.  Gastrointestinal: Negative for abdominal pain and diarrhea.  Endocrine: Negative for polydipsia and polyphagia.  Skin: Negative for color change.  Neurological: Negative for dizziness and weakness.  Psychiatric/Behavioral: Negative for behavioral problems and confusion.       Objective:   Physical Exam  Patient had virtual visit Appears to be in no distress Atraumatic Neuro able to relate and oriented No apparent resp distress Color normal        Assessment & Plan:  Migraine related issues very important for the patient the best candidate minimizing triggers She can bump up the dose of topiramate twice daily when any low front is approaching and then when the weather is doing good just to once daily  Reflux under good control continue current measures famotidine on a regular basis  Flu shot recommended and she will try to get it through her pharmacy but if not she will get it through [place]  Follow-up by [place]

## 2019-10-19 DIAGNOSIS — R69 Illness, unspecified: Secondary | ICD-10-CM | POA: Diagnosis not present

## 2019-12-08 ENCOUNTER — Other Ambulatory Visit: Payer: Self-pay | Admitting: Family Medicine

## 2020-05-02 ENCOUNTER — Other Ambulatory Visit: Payer: Self-pay | Admitting: Family Medicine

## 2020-05-02 NOTE — Telephone Encounter (Signed)
Last seen for this med 10/12/19

## 2020-05-02 NOTE — Telephone Encounter (Signed)
This and 3 refills needs ov by mid summer

## 2020-07-10 ENCOUNTER — Telehealth: Payer: Self-pay | Admitting: Family Medicine

## 2020-07-10 NOTE — Telephone Encounter (Signed)
Left message to return call 

## 2020-07-10 NOTE — Telephone Encounter (Signed)
Pt returned call and is setting up appointment.

## 2020-07-10 NOTE — Telephone Encounter (Signed)
Pt contacted office. Pt has been feeling tired, having headache with nausea, dizziness, staggering (gets worse when moving head side to side or up and down), right ear issue, and bloated/swollen upper abdomen area.this has been going on about 4-5 months. Spoke with provider. Provider has opening for 11 am tomorrow. Pt states she is unable to come tomorrow due to going out of town.pt informed that she would need to go to Urgent Care if unable to come in today.  Pt states she can come next Wednesday or Thursday if that is OK with provider. Please advise. Thank you

## 2020-07-10 NOTE — Telephone Encounter (Signed)
Next week would be fine

## 2020-07-19 ENCOUNTER — Other Ambulatory Visit: Payer: Self-pay | Admitting: Family Medicine

## 2020-07-23 ENCOUNTER — Telehealth: Payer: Self-pay | Admitting: Family Medicine

## 2020-07-23 NOTE — Telephone Encounter (Signed)
Lipid, CMP, sed rate-frequent headaches, hyperlipidemia, dizziness

## 2020-07-23 NOTE — Telephone Encounter (Signed)
Pt has appt on 08/08/20 for dizziness and headache. Pt is wanting to know if she needs any labs. Last labs completed on 09/06/18 D Dimer, CBC, Free T4 and TSH. Pt is aware provider out of office until Thursday. Please advise. Thank you

## 2020-07-24 ENCOUNTER — Other Ambulatory Visit: Payer: Self-pay | Admitting: *Deleted

## 2020-07-24 DIAGNOSIS — E782 Mixed hyperlipidemia: Secondary | ICD-10-CM

## 2020-07-24 DIAGNOSIS — R42 Dizziness and giddiness: Secondary | ICD-10-CM

## 2020-07-24 DIAGNOSIS — R519 Headache, unspecified: Secondary | ICD-10-CM

## 2020-07-24 NOTE — Telephone Encounter (Signed)
Lmtc. Labs ordered.  °

## 2020-07-24 NOTE — Telephone Encounter (Signed)
Patient notified and verbalized understanding. 

## 2020-07-26 LAB — COMPREHENSIVE METABOLIC PANEL
ALT: 10 IU/L (ref 0–32)
AST: 20 IU/L (ref 0–40)
Albumin/Globulin Ratio: 1.6 (ref 1.2–2.2)
Albumin: 4.4 g/dL (ref 3.7–4.7)
Alkaline Phosphatase: 58 IU/L (ref 48–121)
BUN/Creatinine Ratio: 27 (ref 12–28)
BUN: 21 mg/dL (ref 8–27)
Bilirubin Total: 0.5 mg/dL (ref 0.0–1.2)
CO2: 23 mmol/L (ref 20–29)
Calcium: 10.2 mg/dL (ref 8.7–10.3)
Chloride: 105 mmol/L (ref 96–106)
Creatinine, Ser: 0.79 mg/dL (ref 0.57–1.00)
GFR calc Af Amer: 85 mL/min/{1.73_m2} (ref >59)
GFR calc non Af Amer: 73 mL/min/{1.73_m2} (ref >59)
Globulin, Total: 2.7 g/dL (ref 1.5–4.5)
Glucose: 90 mg/dL (ref 65–99)
Potassium: 4.3 mmol/L (ref 3.5–5.2)
Sodium: 141 mmol/L (ref 134–144)
Total Protein: 7.1 g/dL (ref 6.0–8.5)

## 2020-07-26 LAB — LIPID PANEL
Chol/HDL Ratio: 7.5 ratio — ABNORMAL HIGH (ref 0.0–4.4)
Cholesterol, Total: 347 mg/dL — ABNORMAL HIGH (ref 100–199)
HDL: 46 mg/dL (ref >39)
LDL Chol Calc (NIH): 252 mg/dL — ABNORMAL HIGH (ref 0–99)
Triglycerides: 234 mg/dL — ABNORMAL HIGH (ref 0–149)
VLDL Cholesterol Cal: 49 mg/dL — ABNORMAL HIGH (ref 5–40)

## 2020-07-26 LAB — SEDIMENTATION RATE: Sed Rate: 6 mm/hr (ref 0–40)

## 2020-08-01 DIAGNOSIS — H9201 Otalgia, right ear: Secondary | ICD-10-CM | POA: Insufficient documentation

## 2020-08-01 DIAGNOSIS — H903 Sensorineural hearing loss, bilateral: Secondary | ICD-10-CM | POA: Insufficient documentation

## 2020-08-01 DIAGNOSIS — M2669 Other specified disorders of temporomandibular joint: Secondary | ICD-10-CM | POA: Insufficient documentation

## 2020-08-08 ENCOUNTER — Ambulatory Visit: Payer: Medicare HMO | Admitting: Family Medicine

## 2020-08-08 ENCOUNTER — Encounter: Payer: Self-pay | Admitting: Family Medicine

## 2020-08-08 ENCOUNTER — Other Ambulatory Visit: Payer: Self-pay

## 2020-08-08 VITALS — BP 120/82 | Temp 97.9°F | Wt 127.4 lb

## 2020-08-08 DIAGNOSIS — D692 Other nonthrombocytopenic purpura: Secondary | ICD-10-CM | POA: Diagnosis not present

## 2020-08-08 DIAGNOSIS — R270 Ataxia, unspecified: Secondary | ICD-10-CM

## 2020-08-08 DIAGNOSIS — R5383 Other fatigue: Secondary | ICD-10-CM

## 2020-08-08 DIAGNOSIS — E782 Mixed hyperlipidemia: Secondary | ICD-10-CM

## 2020-08-08 DIAGNOSIS — M810 Age-related osteoporosis without current pathological fracture: Secondary | ICD-10-CM | POA: Diagnosis not present

## 2020-08-08 DIAGNOSIS — R600 Localized edema: Secondary | ICD-10-CM | POA: Diagnosis not present

## 2020-08-08 DIAGNOSIS — R519 Headache, unspecified: Secondary | ICD-10-CM

## 2020-08-08 NOTE — Progress Notes (Signed)
Subjective:    Patient ID: Rachael Matthews, female    DOB: [birth year], 74 y.o.   MRN: [id number]  HPI  Patient arrives to discuss H/As, dizziness and retaining of fluid one month ago. Patient states she is doing better. Senile purpura ([place]) - Plan: CBC with Differential/Platelet  Pedal edema  Age-related osteoporosis without current pathological fracture  Mixed hyperlipidemia  Ataxia - Plan: MR Brain Wo Contrast  Other fatigue - Plan: CBC with Differential/Platelet, TSH, T4, free  Frequent headaches - Plan: MR Brain Wo Contrast  Patient relates intermittent headaches over the past couple months she also relates having a lot of problems with feeling off balance difficult time walking without wavering.  She states that she finds her self feeling dizzy and at times cloudy in her thinking.  No forgetfulness.  Denies unilateral numbness weakness. She is also noted some easy bruising when her skin gets hit but denies bleeding issues She also had a spell where she had some dizziness swelling in the lower legs but then that seemed to get better on its own  Review of Systems  Constitutional: Negative for activity change, appetite change and fatigue.  HENT: Negative for congestion and rhinorrhea.   Respiratory: Negative for cough and shortness of breath.   Cardiovascular: Positive for leg swelling. Negative for chest pain.  Gastrointestinal: Negative for abdominal pain and diarrhea.  Endocrine: Negative for polydipsia and polyphagia.  Skin: Negative for color change.  Neurological: Positive for dizziness. Negative for weakness.  Psychiatric/Behavioral: Negative for behavioral problems and confusion.       Objective:   Physical Exam Vitals reviewed.  Constitutional:      General: She is not in acute distress. HENT:     Head: Normocephalic and atraumatic.  Eyes:     General:        Right eye: No discharge.        Left eye: No discharge.  Neck:     Trachea: No tracheal deviation.   Cardiovascular:     Rate and Rhythm: Normal rate and regular rhythm.     Heart sounds: Normal heart sounds. No murmur heard.   Pulmonary:     Effort: Pulmonary effort is normal. No respiratory distress.     Breath sounds: Normal breath sounds.  Lymphadenopathy:     Cervical: No cervical adenopathy.  Skin:    General: Skin is warm and dry.  Neurological:     Mental Status: She is alert.     Coordination: Coordination normal.  Psychiatric:        Behavior: Behavior normal.    Significant ataxia fails Romberg wavers when she walks concerning for the possibility of an underlying stroke no weakness bilateral no numbness bilateral       Assessment & Plan:  1. Senile purpura ([facility]) Some bruising on the arm that occurs easily check CBC await the results  2. Pedal edema No pedal edema currently she states she had this a few weeks ago along with fatigue tiredness but it went away  3. Age-related osteoporosis without current pathological fracture Bone density recommended await the results of this.  4. Mixed hyperlipidemia History hyperlipidemia does not tolerate statins unfortunately cannot afford the other medications  5. Ataxia Significant ataxia not felt to be in her ear concerning for the possibility of a cerebellar stroke please see above recommend MRI of the brain - MR Brain Wo Contrast  6. Other fatigue Moderate fatigue tiredness could be age-related deconditioning but could also be thyroid  or CBC - CBC with Differential/Platelet - TSH - T4, free  7. Frequent headaches Intermittent frequent headaches need to rule out mini strokes - MR Brain Wo Contrast

## 2020-08-09 LAB — CBC WITH DIFFERENTIAL/PLATELET
Basophils Absolute: 0.1 10*3/uL (ref 0.0–0.2)
Basos: 1 %
EOS (ABSOLUTE): 0.4 10*3/uL (ref 0.0–0.4)
Eos: 5 %
Hematocrit: 45.3 % (ref 34.0–46.6)
Hemoglobin: 14.6 g/dL (ref 11.1–15.9)
Immature Grans (Abs): 0 10*3/uL (ref 0.0–0.1)
Immature Granulocytes: 0 %
Lymphocytes Absolute: 2.8 10*3/uL (ref 0.7–3.1)
Lymphs: 32 %
MCH: 28.9 pg (ref 26.6–33.0)
MCHC: 32.2 g/dL (ref 31.5–35.7)
MCV: 90 fL (ref 79–97)
Monocytes Absolute: 1 10*3/uL — ABNORMAL HIGH (ref 0.1–0.9)
Monocytes: 11 %
Neutrophils Absolute: 4.4 10*3/uL (ref 1.4–7.0)
Neutrophils: 51 %
Platelets: 307 10*3/uL (ref 150–450)
RBC: 5.06 x10E6/uL (ref 3.77–5.28)
RDW: 13.2 % (ref 11.7–15.4)
WBC: 8.7 10*3/uL (ref 3.4–10.8)

## 2020-08-09 LAB — T4, FREE: Free T4: 0.97 ng/dL (ref 0.82–1.77)

## 2020-08-09 LAB — TSH: TSH: 2.62 u[IU]/mL (ref 0.450–4.500)

## 2020-08-15 ENCOUNTER — Telehealth: Payer: Self-pay | Admitting: Family Medicine

## 2020-08-15 NOTE — Telephone Encounter (Signed)
Order was signed thank you 

## 2020-08-15 NOTE — Telephone Encounter (Signed)
Order faxed to Attn:  Katharine Look (fax# [phone])

## 2020-08-15 NOTE — Telephone Encounter (Signed)
Please sign order for MRI so I may fax - appointment is tomorrow 08/16/2020  Pt is having it done at [facility] (insurance preference)   In red folder in basket on wall

## 2020-08-16 ENCOUNTER — Encounter: Payer: Self-pay | Admitting: Family Medicine

## 2020-08-22 ENCOUNTER — Telehealth: Payer: Self-pay | Admitting: Family Medicine

## 2020-08-22 NOTE — Telephone Encounter (Signed)
Nurses Please connect with patient let her know that MRI did not show any strokes.  It does show a small area near the skull in the left posterior region which could be a early meningioma which is a type of bone growth that can get larger over time.  The radiologist recommends a repeat MRI in 3 to 6 months.  If it looks good at that time then no need to repeat.  If it is getting larger then we would have to consult with a neurosurgical specialist.  I would recommend that the patient repeat the MRI in December or January.  Please put this into the reminder file. MRI of the brain with contrast due to small dural based lesion possibly a meningioma  The patient should do the MRI at that point and follow-up with [place] a week later.  Please put this into the reminder file.  Patient may follow-up with [place] sooner if any problems.

## 2020-08-23 NOTE — Telephone Encounter (Signed)
Lmtc

## 2020-08-23 NOTE — Telephone Encounter (Signed)
Pt returned call and verbalized understanding. MRI and follow up note placed in reminder file.

## 2020-10-18 ENCOUNTER — Other Ambulatory Visit: Payer: Self-pay | Admitting: Family Medicine

## 2020-12-12 ENCOUNTER — Other Ambulatory Visit: Payer: Self-pay

## 2020-12-12 ENCOUNTER — Other Ambulatory Visit (INDEPENDENT_AMBULATORY_CARE_PROVIDER_SITE_OTHER): Payer: Medicare HMO | Admitting: *Deleted

## 2020-12-12 DIAGNOSIS — Z23 Encounter for immunization: Secondary | ICD-10-CM | POA: Diagnosis not present

## 2020-12-17 ENCOUNTER — Other Ambulatory Visit: Payer: Self-pay | Admitting: *Deleted

## 2020-12-17 ENCOUNTER — Telehealth: Payer: Self-pay | Admitting: *Deleted

## 2020-12-17 NOTE — Telephone Encounter (Signed)
Patient in tickler file for reminder mammo and bone density in December/January as well as repeat MRI Brain with contrast due to small dural based lesion possible meningioma in December or January. Called patient to schedule tests- Patient stated she wants to hold on these tests at this time and will call back when she is ready to proceed with these tests but she does not want them scheduled at this time

## 2020-12-17 NOTE — Telephone Encounter (Signed)
So let us put these into the tickler file for April thank you

## 2020-12-17 NOTE — Telephone Encounter (Signed)
Added to reminder file for April 2022

## 2021-02-03 ENCOUNTER — Telehealth: Payer: Self-pay

## 2021-02-03 NOTE — Telephone Encounter (Signed)
Pt has 6 month follow up Thursday March 10 pt is wanting to know if she will need blood work ordered?  Pt call back [phone]

## 2021-02-03 NOTE — Telephone Encounter (Signed)
Last labs 08/08/20:  TSH, CBC, T4

## 2021-02-04 ENCOUNTER — Other Ambulatory Visit: Payer: Self-pay | Admitting: Family Medicine

## 2021-02-04 NOTE — Telephone Encounter (Signed)
Lipid, metabolic 7 Hyperlipidemia high risk med

## 2021-02-05 ENCOUNTER — Other Ambulatory Visit: Payer: Self-pay | Admitting: *Deleted

## 2021-02-05 DIAGNOSIS — E785 Hyperlipidemia, unspecified: Secondary | ICD-10-CM

## 2021-02-05 DIAGNOSIS — Z79899 Other long term (current) drug therapy: Secondary | ICD-10-CM

## 2021-02-05 MED ORDER — FAMOTIDINE 40 MG PO TABS: ORAL_TABLET | ORAL | 0 refills | Status: DC

## 2021-02-05 NOTE — Telephone Encounter (Signed)
Bw orders put in and pt was notified.

## 2021-02-06 ENCOUNTER — Encounter: Payer: Self-pay | Admitting: Family Medicine

## 2021-03-01 LAB — BASIC METABOLIC PANEL
BUN/Creatinine Ratio: 26 (ref 12–28)
BUN: 19 mg/dL (ref 8–27)
CO2: 20 mmol/L (ref 20–29)
Calcium: 9.9 mg/dL (ref 8.7–10.3)
Chloride: 106 mmol/L (ref 96–106)
Creatinine, Ser: 0.73 mg/dL (ref 0.57–1.00)
Glucose: 85 mg/dL (ref 65–99)
Potassium: 4.8 mmol/L (ref 3.5–5.2)
Sodium: 142 mmol/L (ref 134–144)
eGFR: 86 mL/min/{1.73_m2} (ref >59)

## 2021-03-01 LAB — LIPID PANEL
Chol/HDL Ratio: 7.1 ratio — ABNORMAL HIGH (ref 0.0–4.4)
Cholesterol, Total: 325 mg/dL — ABNORMAL HIGH (ref 100–199)
HDL: 46 mg/dL (ref >39)
LDL Chol Calc (NIH): 255 mg/dL — ABNORMAL HIGH (ref 0–99)
Triglycerides: 131 mg/dL (ref 0–149)
VLDL Cholesterol Cal: 24 mg/dL (ref 5–40)

## 2021-03-06 ENCOUNTER — Encounter: Payer: Self-pay | Admitting: Family Medicine

## 2021-03-06 ENCOUNTER — Other Ambulatory Visit: Payer: Self-pay

## 2021-03-06 ENCOUNTER — Ambulatory Visit: Payer: Medicare HMO | Admitting: Family Medicine

## 2021-03-06 VITALS — BP 126/82 | Ht 59.5 in | Wt 127.0 lb

## 2021-03-06 DIAGNOSIS — E782 Mixed hyperlipidemia: Secondary | ICD-10-CM | POA: Diagnosis not present

## 2021-03-06 DIAGNOSIS — M810 Age-related osteoporosis without current pathological fracture: Secondary | ICD-10-CM

## 2021-03-06 DIAGNOSIS — K219 Gastro-esophageal reflux disease without esophagitis: Secondary | ICD-10-CM | POA: Diagnosis not present

## 2021-03-06 DIAGNOSIS — G43009 Migraine without aura, not intractable, without status migrainosus: Secondary | ICD-10-CM | POA: Diagnosis not present

## 2021-03-06 NOTE — Progress Notes (Signed)
Subjective:    Patient ID: Rachael Matthews, female    DOB: [birth year], 75 y.o.   MRN: [id number]  Hyperlipidemia This is a chronic problem. The current episode started more than 1 year ago. Pertinent negatives include no chest pain or shortness of breath. Current antihyperlipidemic treatment includes diet change.   check recent labs Results for orders placed or performed in visit on 02/05/21  Lipid panel  Result Value Ref Range   Cholesterol, Total 325 (H) 100 - 199 mg/dL   Triglycerides 131 0 - 149 mg/dL   HDL 46 >39 mg/dL   VLDL Cholesterol Cal 24 5 - 40 mg/dL   LDL Chol Calc (NIH) 255 (H) 0 - 99 mg/dL   Chol/HDL Ratio 7.1 (H) 0.0 - 4.4 ratio  Basic metabolic panel  Result Value Ref Range   Glucose 85 65 - 99 mg/dL   BUN 19 8 - 27 mg/dL   Creatinine, Ser 0.73 0.57 - 1.00 mg/dL   eGFR 86 >59 mL/min/1.73   BUN/Creatinine Ratio 26 12 - 28   Sodium 142 134 - 144 mmol/L   Potassium 4.8 3.5 - 5.2 mmol/L   Chloride 106 96 - 106 mmol/L   CO2 20 20 - 29 mmol/L   Calcium 9.9 8.7 - 10.3 mg/dL    Age-related osteoporosis without current pathological fracture  Mixed hyperlipidemia  Migraine without aura and without status migrainosus, not intractable  Gastroesophageal reflux disease without esophagitis    Review of Systems  Constitutional: Negative for activity change, appetite change and fatigue.  HENT: Negative for congestion and rhinorrhea.   Respiratory: Negative for cough and shortness of breath.   Cardiovascular: Negative for chest pain and leg swelling.  Gastrointestinal: Negative for abdominal pain and diarrhea.  Endocrine: Negative for polydipsia and polyphagia.  Skin: Negative for color change.  Neurological: Negative for dizziness and weakness.  Psychiatric/Behavioral: Negative for behavioral problems and confusion.       Objective:   Physical Exam Vitals reviewed.  Constitutional:      General: She is not in acute distress. HENT:     Head: Normocephalic  and atraumatic.  Eyes:     General:        Right eye: No discharge.        Left eye: No discharge.  Neck:     Trachea: No tracheal deviation.  Cardiovascular:     Rate and Rhythm: Normal rate and regular rhythm.     Heart sounds: Normal heart sounds. No murmur heard.   Pulmonary:     Effort: Pulmonary effort is normal. No respiratory distress.     Breath sounds: Normal breath sounds.  Lymphadenopathy:     Cervical: No cervical adenopathy.  Skin:    General: Skin is warm and dry.  Neurological:     Mental Status: She is alert.     Coordination: Coordination normal.  Psychiatric:        Behavior: Behavior normal.           Assessment & Plan:  1. Age-related osteoporosis without current pathological fracture Patient is hesitant to continue to take the IV recollapsed she states that it did cause her to have severe body aches and discomfort we are looking into Prolia  2. Mixed hyperlipidemia Severe hyperlipidemia but patient does not tolerate statins.  Also did try injectables but cost was very high currently working with diet  3. Migraine without aura and without status migrainosus, not intractable Blood pressure is been doing well Topamax is  helping keeping her headaches under decent control  4. Gastroesophageal reflux disease without esophagitis She is taking her Pepcid regular basis to keep reflux under control Follow-up in 6 months 

## 2021-03-09 ENCOUNTER — Other Ambulatory Visit: Payer: Self-pay | Admitting: Family Medicine

## 2021-03-09 ENCOUNTER — Telehealth: Payer: Self-pay | Admitting: Family Medicine

## 2021-03-09 DIAGNOSIS — R930 Abnormal findings on diagnostic imaging of skull and head, not elsewhere classified: Secondary | ICD-10-CM

## 2021-03-09 NOTE — Telephone Encounter (Signed)
Patient had MRI back in August 2021 Showed a thickening with early meningioma suspected It was recommended to do a follow-up in 3 to 6 months This was in the parietal region on outside films I will connect with radiology to confirm if CT scan of the head with contrast would be the best option

## 2021-03-13 ENCOUNTER — Telehealth: Payer: Self-pay | Admitting: Family Medicine

## 2021-03-13 NOTE — Telephone Encounter (Signed)
Please let the patient know that the local endocrinologist does do Prolia injections for osteoporosis.  Unfortunately I am not able to determine with my electronics that this is covered The patient will need to call [employer] does her medication coverage to see if Prolia is covered in the it could even tell her the cost.  We are more than willing to do the referral.  Patient can let us know when she is interested in doing so and we can move forward with the referral

## 2021-03-13 NOTE — Telephone Encounter (Signed)
Patient advised per Dr Nicki Reaper: Please let the patient know that the local endocrinologist does do Prolia injections for osteoporosis.   Unfortunately Dr Nicki Reaper is not able to determine with his  electronics that this is covered The patient will need to call [employer]/whoever does her medication coverage to see if Prolia is covered in the it could even tell her the cost.  We are more than willing to do the referral.  Patient can let us know when she is interested in doing so and we can move forward with the referral  Patient verbalized understanding and will call back if she wants to proceed with referral

## 2021-03-21 NOTE — Telephone Encounter (Signed)
Patient notified and stated she is willing to proceed with repeat MRI- she is ok with [facility] for MRI. MRI ordered in Epic and awaiting precert and schedule.

## 2021-03-21 NOTE — Telephone Encounter (Signed)
Nurses Ellaree Gear did an MRI back in August of last year up in [place] a thickening of the dura which was concerning for possibility of meningioma I did speak with the radiology specialist they recommend doing a follow-up MRI with contrast of the brain to look at the dura for the possibility of a meningioma  #1 please talk with patient #2 we need to order MRI of the brain with contrast #3 please clarify with patient is insistent she wants done up in [place] or with [place] #4 please proceed forward with the process #5 if it is ordered in [place] please flag when the MRI is going to be done so we can make sure that we get the report thank you

## 2021-03-21 NOTE — Addendum Note (Signed)
Addended by: Dairl Ponder on: 03/21/2021 05:06 PM   Modules accepted: Orders

## 2021-04-02 ENCOUNTER — Telehealth: Payer: Self-pay | Admitting: Family Medicine

## 2021-04-16 ENCOUNTER — Other Ambulatory Visit: Payer: Self-pay

## 2021-04-16 ENCOUNTER — Ambulatory Visit (HOSPITAL_COMMUNITY)
Admission: RE | Admit: 2021-04-16 | Discharge: 2021-04-16 | Disposition: A | Discharge status: Home / Self Care | Payer: Medicare HMO | Source: Ambulatory Visit | Attending: Family Medicine | Admitting: Family Medicine

## 2021-04-16 DIAGNOSIS — R930 Abnormal findings on diagnostic imaging of skull and head, not elsewhere classified: Secondary | ICD-10-CM | POA: Insufficient documentation

## 2021-04-21 ENCOUNTER — Encounter: Payer: Self-pay | Admitting: Family Medicine

## 2021-04-21 ENCOUNTER — Telehealth: Payer: Self-pay | Admitting: Family Medicine

## 2021-04-21 DIAGNOSIS — D329 Benign neoplasm of meninges, unspecified: Secondary | ICD-10-CM

## 2021-04-21 HISTORY — DX: Benign neoplasm of meninges, unspecified: D32.9

## 2021-04-21 NOTE — Telephone Encounter (Signed)
Small meningioma noted on MRI.  Stable since 2013.  Please touch base with neurosurgery to see if neurosurgeon or PA with neurosurgery could call me regarding this patient.  My cell #[phone]  This is not an emergency situation.  Just a routine clinical question.  Main reason is to see a follow-up MRI would be indicated

## 2021-04-21 NOTE — Telephone Encounter (Signed)
Neurosurgical provider on call paged to Dr Bary Leriche cell phone.

## 2021-04-22 NOTE — Telephone Encounter (Signed)
FYI-I did speak with neurosurgery.  Please see result note on her MRI thank you so much

## 2021-05-05 ENCOUNTER — Other Ambulatory Visit: Payer: Self-pay | Admitting: Family Medicine

## 2021-08-02 ENCOUNTER — Other Ambulatory Visit: Payer: Self-pay | Admitting: Family Medicine

## 2021-09-09 ENCOUNTER — Ambulatory Visit (INDEPENDENT_AMBULATORY_CARE_PROVIDER_SITE_OTHER): Payer: Medicare HMO | Admitting: Family Medicine

## 2021-09-09 ENCOUNTER — Ambulatory Visit: Payer: Medicare HMO | Admitting: Family Medicine

## 2021-09-09 ENCOUNTER — Encounter: Payer: Self-pay | Admitting: Family Medicine

## 2021-09-09 ENCOUNTER — Other Ambulatory Visit: Payer: Self-pay

## 2021-09-09 VITALS — BP 130/82 | HR 84 | Temp 97.3°F | Ht 59.5 in | Wt 130.0 lb

## 2021-09-09 DIAGNOSIS — M810 Age-related osteoporosis without current pathological fracture: Secondary | ICD-10-CM

## 2021-09-09 DIAGNOSIS — Z23 Encounter for immunization: Secondary | ICD-10-CM

## 2021-09-09 DIAGNOSIS — G43009 Migraine without aura, not intractable, without status migrainosus: Secondary | ICD-10-CM

## 2021-09-09 NOTE — Patient Instructions (Signed)

## 2021-09-09 NOTE — Progress Notes (Addendum)
   Subjective:    Patient ID: Rachael Matthews, female    DOB: 02/23/1946, 75 y.o.   MRN: [id number]  HPI  Osteoporosis- discuss medications Patient has significant osteoporosis.  She did not tolerate Fosamax.  Did not tolerate IV Reclast She states she did talk with her insurance and found out that Prolia is covered She denies any issues or problems currently  Migraines- on topomax she typically takes just 1/day and this does well for her she does relate that at times when little friends move then it will cause her enough problems where she has to take 2/day and that is better she also occasionally takes an Aleve or Tylenol denies any severe setbacks with her migraines  Patient also has severe hyperlipidemia but cannot tolerate statins and the high cost of Repatha is prohibitive for her  Shingles vaccine questions-we did discuss her shingles vaccine and the efficacy she is desiring to go ahead and get it at this point    Review of Systems     Objective:   Physical Exam Lungs are clear heart regular pulse normal BP good       Assessment & Plan:  1. Migraine without aura and without status migrainosus, not intractable Significant migraine issues currently doing Topamax 1 a day and will take it to daily on days that the weather is bad this seems to be working for her she will continue this currently - Vitamin D (25 hydroxy) - Basic metabolic panel  2. Age-related osteoporosis without current pathological fracture Significant osteoporosis did not tolerate biphosphonate's We will look into seeing if prolia can be acquired, covered and administered here if not we will go through endocrinology Will touch base with clinical pharmacy regarding this patient - Vitamin D (25 hydroxy) - Basic metabolic panel  3. Encounter for immunization Flu vaccine high-dose today - Flu Vaccine QUAD High Dose(Fluad)  COVID booster recommended Shingles vaccine recommended Tdap  recommended  Addendum Unfortunately the hospital outpatient clinic no longer does Prolia  Also we are not able to do Prolia in our office It is best we go ahead and help set her up with an appointment with [facility] for further evaluation for treatment with Prolia we will notify the patient

## 2021-09-10 ENCOUNTER — Telehealth: Payer: Self-pay | Admitting: Family Medicine

## 2021-09-10 ENCOUNTER — Other Ambulatory Visit: Payer: Self-pay | Admitting: Family Medicine

## 2021-09-10 LAB — BASIC METABOLIC PANEL
BUN/Creatinine Ratio: 18 (ref 12–28)
BUN: 13 mg/dL (ref 8–27)
CO2: 24 mmol/L (ref 20–29)
Calcium: 10 mg/dL (ref 8.7–10.3)
Chloride: 103 mmol/L (ref 96–106)
Creatinine, Ser: 0.72 mg/dL (ref 0.57–1.00)
Glucose: 72 mg/dL (ref 65–99)
Potassium: 4.3 mmol/L (ref 3.5–5.2)
Sodium: 141 mmol/L (ref 134–144)
eGFR: 87 mL/min/{1.73_m2} (ref >59)

## 2021-09-10 LAB — VITAMIN D 25 HYDROXY (VIT D DEFICIENCY, FRACTURES): Vit D, 25-Hydroxy: 20.2 ng/mL — ABNORMAL LOW (ref 30.0–100.0)

## 2021-09-10 MED ORDER — VITAMIN D (ERGOCALCIFEROL) 1.25 MG (50000 UNIT) PO CAPS: ORAL_CAPSULE | ORAL | 1 refills | Status: DC

## 2021-09-10 NOTE — Telephone Encounter (Signed)
Nurses Please see CC Chart wrap-up follow-up message regarding this patient for Prolia shots  With that message I stated Rachael Matthews Second option for the patient would be Dr. Dorris Fetch here in town endocrinology both are good groups  Please help set up referral for osteoporosis Prolia consideration injection for either Dr. Liliane Channel group or [facility] whichever 1 she chooses

## 2021-09-11 NOTE — Progress Notes (Signed)
09/11/21-Left message to return call and also sent my chart message

## 2021-09-11 NOTE — Addendum Note (Signed)
Addended by: Vicente Males on: 09/11/2021 09:25 AM   Modules accepted: Orders

## 2021-09-11 NOTE — Telephone Encounter (Signed)
Left message to return call; sent my chart message.  

## 2021-09-11 NOTE — Progress Notes (Signed)
09/11/21- pt returned call but nurse was on phone at time of return call Returned call to patient; pt is in agreeance with referral. Referral placed to [facility]. Recent labs, office visit and DEXA scan faxed to [place] [phone])

## 2021-09-11 NOTE — Telephone Encounter (Signed)
Pt agreeable with referral. Recent office notes, labs and DEXA scan faxed to [facility].

## 2021-09-16 ENCOUNTER — Telehealth: Payer: Self-pay | Admitting: *Deleted

## 2021-09-16 NOTE — Chronic Care Management (AMB) (Signed)
  Chronic Care Management   Note  09/16/2021 Name: Rachael Matthews MRN: [id number] DOB: 07-Oct-1946  Rachael Matthews is a 75 y.o. year old female who is a primary care patient of Luking, Elayne Snare, MD. I reached out to Cherylann Ratel by phone today in response to a referral sent by Rachael Matthews PCP, Dr. Wolfgang Phoenix.      Rachael Matthews was given information about Chronic Care Management services today including:  CCM service includes personalized support from designated clinical staff supervised by her physician, including individualized plan of care and coordination with other care providers 24/7 contact phone numbers for assistance for urgent and routine care needs. Service will only be billed when office clinical staff spend 20 minutes or more in a month to coordinate care. Only one practitioner may furnish and bill the service in a calendar month. The patient may stop CCM services at any time (effective at the end of the month) by phone call to the office staff. The patient will be responsible for cost sharing (co-pay) of up to 20% of the service fee (after annual deductible is met).  Patient agreed to services and verbal consent obtained.   Follow up plan: Telephone appointment with care management team member scheduled for:09/22/21  [place] Management  Direct Dial: [phone]

## 2021-09-22 ENCOUNTER — Ambulatory Visit (INDEPENDENT_AMBULATORY_CARE_PROVIDER_SITE_OTHER): Payer: Medicare HMO | Admitting: Pharmacist

## 2021-09-22 DIAGNOSIS — E782 Mixed hyperlipidemia: Secondary | ICD-10-CM

## 2021-09-22 DIAGNOSIS — M810 Age-related osteoporosis without current pathological fracture: Secondary | ICD-10-CM

## 2021-09-22 NOTE — Patient Instructions (Signed)
Rachael Matthews,  It was great to talk to you today!  Please call me with any questions or concerns.   It can be hard choosing a Medicare plan. A group that I always recommend for my patients is called [facility]). With this program, they offer free counseling to Medicare beneficiaries and caregivers about Medicare, Medicare supplements, Medicare Advantage, Medicare Part D, and long-term care insurance. [place] counselors are not [employer], and they do not sell or endorse any product, plan, or company. The counselors are volunteers and they always offer unbiased information regarding Medicare health care products.  [facility] has counselors in every county across the state who are trained to be the [place] people for seniors and Medicare beneficiaries in their local communities. Local counseling is done by appointment in each county.   The [facility] local counseling team's information can be found below. I recommend that you give them a call and set up an appointment (Ask for [place]).    [facility]     [address] [place]  [address] [phone]   For more information: [email or web address] or just google "[place] [facility]"   Visit Information   PATIENT GOALS:   Goals Addressed             This Visit's Progress    Medication Management       Patient Goals/Self-Care Activities Over the next 90 days, patient will:  Take medications as prescribed Collaborate with provider on medication access solutions Start taking calcium citrate + vitamin D3 twice daily with food for osteoporosis         Consent to CCM Services: [name] was given information about Chronic Care Management services including:  CCM service includes  personalized support from designated clinical staff supervised by her physician, including individualized plan of care and coordination with other care providers 24/7 contact phone numbers for assistance for urgent and routine care needs. Service will only be billed when office clinical staff spend 20 minutes or more in a month to coordinate care. Only one practitioner may furnish and bill the service in a calendar month. The patient may stop CCM services at any time (effective at the end of the month) by phone call to the office staff. The patient will be responsible for cost sharing (co-pay) of up to 20% of the service fee (after annual deductible is met).  Patient agreed to services and verbal consent obtained.   Patient verbalizes understanding of instructions provided today and agrees to view in [place].   Telephone follow up appointment with care management team member scheduled for:12/12/21  Kennon Holter, PharmD Clinical Pharmacist [place] [phone]  CLINICAL CARE PLAN: Patient Care Plan: Medication Management     Problem Identified: Hyperlipidemia and Osteoporosis   Priority: High  Onset Date: 09/22/2021     Long-Range Goal: Disease Progression Prevention   Start Date: 09/22/2021  Expected End Date: 12/21/2021  This Visit's Progress: On track  Priority: High  Note:   Current Barriers:  Unable to achieve control of hyperlipidemia and osteoporosis Suboptimal therapeutic regimen for hyperlipidemia  Pharmacist Clinical Goal(s):  Over the next 90 days, patient will Achieve control of hyperlipidemia and osteoporosis as evidenced by improved LDL and improved bone mineral density through collaboration with PharmD and provider.   Interventions: 1:1 collaboration with Kathyrn Drown, MD regarding development and update of comprehensive plan of care as evidenced by provider attestation and co-signature  Inter-disciplinary care team collaboration (see  longitudinal plan of care) Comprehensive medication review performed; medication list updated in electronic medical record  Hyperlipidemia: Uncontrolled. LDL above goal of <70 due to very high risk given cerebral small vessel disease and family history of cardiovascular disease per 2020 AACE/ACE guidelines. Triglycerides at goal of <150 per 2020 AACE/ACE guidelines. Patient has not been formally diagnosed with heterozygous or homozygous familial hypercholesterolemia but suspicion present given family history of cardiovascular disease and extremely elevated LDL levels without large body habitus Patient has a history of taking Repatha and Praluent which she tolerated and were effective. LDL was decreased to 61. However, she has been unable to afford and has not been on these medications in quite some time. LDL now rebounded back to >200.  Patient workup completed for Praluent patient assistance program; however, the patient does not qualify based on household size and income. She would have to pay the co-pay through her insurance which has been challenging since she reaches the coverage gap relatively quickly in the year and is unable to continue to pay the co-pay. Current medications:  none Intolerances:  Statins and ezetimibe (memory impairment) Taking medications as directed: n/a Side effects thought to be attributed to current medication regimen: n/a Reviewed risks of hyperlipidemia, principles of treatment and consequences of untreated hyperlipidemia Statin intolerance noted. No statin due to serious side effects (ex. Myalgias with at least 2 different statins) Recommended that the patient consider restarting PCSK9 inhibitor therapy with either Praluent or Repatha. Patient will discuss with her insurance company and weigh her options at this time.   Osteoporosis: Appropriately managed; referral placed to [facility] to initiate Prolia (denosumab) 60 mg subcutaneously every 6  months Current medications:  vitamin D 50,000 units once weekly Intolerances:  alendronate (esophagitis) and zoledronic acid (fatigue and muscle/joint pain 1 week after infusion) Last zoledronic acid (Reclast) infusion in 2019 Taking medications as directed: yes Side effects thought to be attributed to current medication regimen: no Last DEXA (02/06/21): T score = -3.1 R femur neck and -2.8 L femur neck Falls reported in last year: 0 Current exercise: not discussed today Add denosumab (Prolia) 60 mg subcutaneously every 6 months Recommend 1,000 mg of elemental calcium per day from diet or supplementation. Supplemental calcium not necessary if appropriate amount obtained through diet. Calcium citrate supplementation preferred due to chronic gastric acid-suppressive therapy Recommend 800 - 2,000 IU of vitamin D supplementation per day to maintain adequate vitamin D levels once course of high dose vitamin D completed to resolve low vitamin D level Recommend walking, low impact aerobic exercise, or strength training  Patient Goals/Self-Care Activities Over the next 90 days, patient will:  Take medications as prescribed Collaborate with provider on medication access solutions Start taking calcium citrate + vitamin D3 twice daily with food for osteoporosis   Follow Up Plan: Telephone follow up appointment with care management team member scheduled for: 12/12/21

## 2021-09-22 NOTE — Chronic Care Management (AMB) (Signed)
Chronic Care Management Pharmacy Note  09/22/2021 Name:  Rachael Matthews MRN:  [id number] DOB:  October 22, 1946  Summary:  Hyperlipidemia: Uncontrolled. LDL well above goal  Patient has a history of taking Repatha and Praluent which she tolerated and were effective. LDL was decreased to 61. However, she has been unable to afford and has not been on these medications in quite some time. LDL now rebounded back to >200.  Patient workup completed for Praluent patient assistance program; however, the patient does not qualify based on household size and income. She would have to pay the co-pay through her insurance which has been challenging since she reaches the coverage gap relatively quickly in the year and is unable to continue to pay the co-pay. Recommended that the patient consider restarting PCSK9 inhibitor therapy with either Praluent or Repatha. Patient will discuss with her insurance company and weigh her options at this time.   Osteoporosis: Referral placed by PCP for [facility] to add denosumab (Prolia) 60 mg subcutaneously every 6 months Recommend 1,000 mg of elemental calcium per day from diet or supplementation. Supplemental calcium not necessary if appropriate amount obtained through diet. Calcium citrate supplementation preferred due to gastric acid-suppressive therapy (Pepcid daily + TUMS as needed) Recommend 800 - 2,000 IU of vitamin D supplementation per day to maintain adequate vitamin D levels once course of high dose vitamin D completed to resolve low vitamin D level  Subjective: Rachael Matthews is an 75 y.o. year old female who is a primary patient of Luking, Elayne Snare, MD.  The CCM team was consulted for assistance with disease management and care coordination needs.    Engaged with patient by telephone for initial visit in response to provider referral for pharmacy case management and/or care coordination services.   Consent to Services:  The patient was given  the following information about Chronic Care Management services today, agreed to services, and gave verbal consent: 1. CCM service includes personalized support from designated clinical staff supervised by the primary care provider, including individualized plan of care and coordination with other care providers 2. 24/7 contact phone numbers for assistance for urgent and routine care needs. 3. Service will only be billed when office clinical staff spend 20 minutes or more in a month to coordinate care. 4. Only one practitioner may furnish and bill the service in a calendar month. 5.The patient may stop CCM services at any time (effective at the end of the month) by phone call to the office staff. 6. The patient will be responsible for cost sharing (co-pay) of up to 20% of the service fee (after annual deductible is met). Patient agreed to services and consent obtained.  Patient Care Team: Kathyrn Drown, MD as PCP - General (Family Medicine) Kathyrn Drown, MD (Family Medicine) Gala Romney Cristopher Estimable, MD as Consulting Physician (Gastroenterology) Beryle Lathe, [facility] (Pharmacist)  Objective:  Lab Results  Component Value Date   CREATININE 0.72 09/09/2021   CREATININE 0.73 02/28/2021   CREATININE 0.79 07/25/2020    No results found for: HGBA1C Last diabetic Eye exam: No results found for: HMDIABEYEEXA  Last diabetic Foot exam: No results found for: HMDIABFOOTEX      Component Value Date/Time   CHOL 325 (H) 02/28/2021 0859   TRIG 131 02/28/2021 0859   HDL 46 02/28/2021 0859   CHOLHDL 7.1 (H) 02/28/2021 0859   CHOLHDL 2.6 07/01/2016 0846   VLDL 24 07/01/2016 0846   LDLCALC 255 (H) 02/28/2021 4270  Hepatic Function Latest Ref Rng & Units 07/25/2020 08/06/2018 11/11/2017  Total Protein 6.0 - 8.5 g/dL 7.1 6.6 6.9  Albumin 3.7 - 4.7 g/dL 4.4 4.1 3.9  AST 0 - 40 IU/L $Remov'20 18 20  'mUmChc$ ALT 0 - 32 IU/L $Remov'10 12 10  'RUnBGG$ Alk Phosphatase 48 - 121 IU/L 58 46 48  Total Bilirubin 0.0 - 1.2 mg/dL 0.5 0.4  0.4  Bilirubin, Direct 0.00 - 0.40 mg/dL - 0.10 0.09    Lab Results  Component Value Date/Time   TSH 2.620 08/08/2020 10:03 AM   TSH 2.740 09/06/2018 09:04 AM   FREET4 0.97 08/08/2020 10:03 AM   FREET4 1.11 09/06/2018 09:04 AM    CBC Latest Ref Rng & Units 08/08/2020 09/06/2018 03/28/2018  WBC 3.4 - 10.8 x10E3/uL 8.7 8.1 7.5  Hemoglobin 11.1 - 15.9 g/dL 14.6 14.1 14.0  Hematocrit 34.0 - 46.6 % 45.3 41.5 43.3  Platelets 150 - 450 x10E3/uL 307 342 269    Lab Results  Component Value Date/Time   VD25OH 20.2 (L) 09/09/2021 11:30 AM    Clinical ASCVD: No  The ASCVD Risk score (Arnett DK, et al., 2019) failed to calculate for the following reasons:   The valid total cholesterol range is 130 to 320 mg/dL     Social History   Tobacco Use  Smoking Status Never  Smokeless Tobacco Never   BP Readings from Last 3 Encounters:  09/09/21 130/82  03/06/21 126/82  08/08/20 120/82   Pulse Readings from Last 3 Encounters:  09/09/21 84  08/12/18 76  07/11/18 73   Wt Readings from Last 3 Encounters:  09/09/21 130 lb (59 kg)  03/06/21 127 lb (57.6 kg)  08/08/20 127 lb 6.4 oz (57.8 kg)    Assessment: Review of patient past medical history, allergies, medications, health status, including review of consultants reports, laboratory and other test data, was performed as part of comprehensive evaluation and provision of chronic care management services.   SDOH:  (Social Determinants of Health) assessments and interventions performed:    CCM Care Plan  Allergies  Allergen Reactions   Codeine Other (See Comments)    Jittery and unable to sleep   Reclast [Zoledronic Acid]     Joint and muscle pain and fatigue for 1 week after infusion   Statins Other (See Comments)    "Cant remember how to do anything"   Fosamax [Alendronate Sodium] Other (See Comments)    esophagitis    Medications Reviewed Today     Reviewed by Beryle Lathe, [facility] (Pharmacist) on 09/22/21 at 1045  Med  List Status: <None>   Medication Order Taking? Sig Documenting Provider Last Dose Status Informant  calcium carbonate (TUMS - DOSED IN MG ELEMENTAL CALCIUM) 500 MG chewable tablet [id number] Yes Chew 1 tablet by mouth daily as needed for indigestion or heartburn. [provider] Taking Active Self  famotidine (PEPCID) 40 MG tablet [id number] Yes TAKE 1 TABLET BY MOUTH DAILY  Patient taking differently: Take 40 mg by mouth every evening.   Kathyrn Drown, MD Taking Active Self  fexofenadine (ALLEGRA) 180 MG tablet [id number] Yes Take 180 mg by mouth in the morning. [provider] Taking Active Self  Lactase (LACTAID PO) [id number] Yes Take 1 tablet by mouth daily as needed (for lactose).  [provider] Taking Active Self  topiramate (TOPAMAX) 25 MG tablet [id number] Yes TAKE 1 TABLET BY MOUTH TWICE DAILY Luking, Elayne Snare, MD Taking Active  Med Note Beryle Lathe   Mon Sep 22, 2021 10:42 AM) Dewaine Conger once a day normally except when she feels a trigger (weather related) coming on then she increases to twice daily   Vitamin D, Ergocalciferol, (DRISDOL) 1.25 MG (50000 UNIT) CAPS capsule [id number] Yes Take one capsule po once weekly for 8 weeks Kathyrn Drown, MD Taking Active             Patient Active Problem List   Diagnosis Date Noted   Meningioma ([place]) 04/21/2021   Family history of premature coronary artery disease 04/27/2016   Diverticulosis of colon without hemorrhage    Irritable bowel syndrome with diarrhea 12/10/2015   Migraine 08/12/2015   GERD (gastroesophageal reflux disease) 07/06/2014   Osteoporosis 11/19/2013   Hyperlipemia 07/11/2013   Other nonspecific abnormal result of function study of brain and central nervous system 02/20/2013   Shortness of breath 07/31/2011    Immunization History  Administered Date(s) Administered   Fluad Quad(high Dose 65+) 12/12/2020, 09/09/2021   Influenza Split 11/15/2013   Influenza,inj,Quad  PF,6+ Mos 12/03/2014, 10/29/2015, 12/23/2016, 12/01/2017, 11/29/2018   Influenza-Unspecified 09/27/2012   Moderna Sars-Covid-2 Vaccination 03/11/2020, 04/11/2020   Pneumococcal Conjugate-13 12/03/2014   Pneumococcal Polysaccharide-23 11/15/2013   Zoster, Live 12/02/2012    Conditions to be addressed/monitored: HLD and osteoporosis  Care Plan : Medication Management  Updates made by Beryle Lathe, [place] since 09/22/2021 12:00 AM     Problem: Hyperlipidemia and Osteoporosis   Priority: High  Onset Date: 09/22/2021     Long-Range Goal: Disease Progression Prevention   Start Date: 09/22/2021  Expected End Date: 12/21/2021  This Visit's Progress: On track  Priority: High  Note:   Current Barriers:  Unable to achieve control of hyperlipidemia and osteoporosis Suboptimal therapeutic regimen for hyperlipidemia  Pharmacist Clinical Goal(s):  Over the next 90 days, patient will Achieve control of hyperlipidemia and osteoporosis as evidenced by improved LDL and improved bone mineral density through collaboration with PharmD and provider.   Interventions: 1:1 collaboration with Kathyrn Drown, MD regarding development and update of comprehensive plan of care as evidenced by provider attestation and co-signature Inter-disciplinary care team collaboration (see longitudinal plan of care) Comprehensive medication review performed; medication list updated in electronic medical record  Hyperlipidemia: Uncontrolled. LDL above goal of <70 due to very high risk given cerebral small vessel disease and family history of cardiovascular disease per 2020 AACE/ACE guidelines. Triglycerides at goal of <150 per 2020 AACE/ACE guidelines. Patient has not been formally diagnosed with heterozygous or homozygous familial hypercholesterolemia but suspicion present given family history of cardiovascular disease and extremely elevated LDL levels without large body habitus Patient has a history of taking  Repatha and Praluent which she tolerated and were effective. LDL was decreased to 61. However, she has been unable to afford and has not been on these medications in quite some time. LDL now rebounded back to >200.  Patient workup completed for Praluent patient assistance program; however, the patient does not qualify based on household size and income. She would have to pay the co-pay through her insurance which has been challenging since she reaches the coverage gap relatively quickly in the year and is unable to continue to pay the co-pay. Current medications:  none Intolerances:  Statins and ezetimibe (memory impairment) Taking medications as directed: n/a Side effects thought to be attributed to current medication regimen: n/a Reviewed risks of hyperlipidemia, principles of treatment and consequences of untreated hyperlipidemia Statin intolerance noted. No statin due to  serious side effects (ex. Myalgias with at least 2 different statins) Recommended that the patient consider restarting PCSK9 inhibitor therapy with either Praluent or Repatha. Patient will discuss with her insurance company and weigh her options at this time.   Osteoporosis: Appropriately managed; referral placed to [facility] to initiate Prolia (denosumab) 60 mg subcutaneously every 6 months Current medications:  vitamin D 50,000 units once weekly Intolerances:  alendronate (esophagitis) and zoledronic acid (fatigue and muscle/joint pain 1 week after infusion) Last zoledronic acid (Reclast) infusion in 2019 Taking medications as directed: yes Side effects thought to be attributed to current medication regimen: no Last DEXA (02/06/21): T score = -3.1 R femur neck and -2.8 L femur neck Falls reported in last year: 0 Current exercise: not discussed today Add denosumab (Prolia) 60 mg subcutaneously every 6 months Recommend 1,000 mg of elemental calcium per day from diet or supplementation. Supplemental calcium  not necessary if appropriate amount obtained through diet. Calcium citrate supplementation preferred due to chronic gastric acid-suppressive therapy Recommend 800 - 2,000 IU of vitamin D supplementation per day to maintain adequate vitamin D levels once course of high dose vitamin D completed to resolve low vitamin D level Recommend walking, low impact aerobic exercise, or strength training  Patient Goals/Self-Care Activities Over the next 90 days, patient will:  Take medications as prescribed Collaborate with provider on medication access solutions Start taking calcium citrate + vitamin D3 twice daily with food for osteoporosis   Follow Up Plan: Telephone follow up appointment with care management team member scheduled for: 12/12/21      Medication Assistance:  patient does not qualify  Patient's preferred pharmacy is:  [facility] [address] [place] [address] Phone: [phone] Fax: [place], [place] [address] [place] [address] Phone: [phone] Fax: [phone]  Follow Up:  Patient agrees to Care Plan and Follow-up.  Plan: Telephone follow up appointment with care management team member scheduled for:  12/12/21 Patient instructed to call should any other urgent needs arise in the meantime  Kennon Holter, PharmD Clinical Pharmacist [place] [phone]

## 2021-09-26 DIAGNOSIS — M810 Age-related osteoporosis without current pathological fracture: Secondary | ICD-10-CM

## 2021-09-26 DIAGNOSIS — E782 Mixed hyperlipidemia: Secondary | ICD-10-CM | POA: Diagnosis not present

## 2021-10-07 ENCOUNTER — Telehealth: Payer: Self-pay

## 2021-10-07 ENCOUNTER — Ambulatory Visit (INDEPENDENT_AMBULATORY_CARE_PROVIDER_SITE_OTHER): Payer: Medicare HMO

## 2021-10-07 ENCOUNTER — Other Ambulatory Visit: Payer: Self-pay

## 2021-10-07 VITALS — BP 142/84 | HR 68 | Temp 97.9°F | Ht 59.5 in | Wt 132.4 lb

## 2021-10-07 DIAGNOSIS — Z0000 Encounter for general adult medical examination without abnormal findings: Secondary | ICD-10-CM | POA: Diagnosis not present

## 2021-10-07 NOTE — Progress Notes (Addendum)
Subjective:   Rachael Matthews is a 75 y.o. female who presents for an Initial Medicare Annual Wellness Visit.  Review of Systems     Cardiac Risk Factors include: advanced age (>50men, >28 women);sedentary lifestyle     Objective:    Today's Vitals   10/07/21 0905  BP: (!) 142/84  Pulse: 68  Temp: 97.9 F (36.6 C)  SpO2: 98%  Weight: 132 lb 6.4 oz (60.1 kg)  Height: 4' 11.5" (1.511 m)   Body mass index is 26.29 kg/m.  Advanced Directives 10/07/2021 04/04/2018 03/28/2018 12/18/2015  Does Patient Have a Medical Advance Directive? No No No No;Yes  Type of Advance Directive - - - Living will  Copy of [place] in Chart? - - - Yes  Would patient like information on creating a medical advance directive? No - Patient declined No - Patient declined No - Patient declined -    Current Medications (verified) Outpatient Encounter Medications as of 10/07/2021  Medication Sig   calcium carbonate (TUMS - DOSED IN MG ELEMENTAL CALCIUM) 500 MG chewable tablet Chew 1 tablet by mouth daily as needed for indigestion or heartburn.   famotidine (PEPCID) 40 MG tablet TAKE 1 TABLET BY MOUTH DAILY (Patient taking differently: Take 40 mg by mouth every evening.)   fexofenadine (ALLEGRA) 180 MG tablet Take 180 mg by mouth in the morning.   Lactase (LACTAID PO) Take 1 tablet by mouth daily as needed (for lactose).    naproxen sodium (ALEVE) 220 MG tablet Take 220 mg by mouth daily as needed.   Polyethyl Glycol-Propyl Glycol 0.4-0.3 % SOLN Place 1 drop into both eyes 2 (two) times daily as needed.   topiramate (TOPAMAX) 25 MG tablet TAKE 1 TABLET BY MOUTH TWICE DAILY   Vitamin D, Ergocalciferol, (DRISDOL) 1.25 MG (50000 UNIT) CAPS capsule Take one capsule po once weekly for 8 weeks   No facility-administered encounter medications on file as of 10/07/2021.    Allergies (verified) Codeine, Reclast [zoledronic acid], Statins, and Fosamax [alendronate sodium]   History: Past Medical  History:  Diagnosis Date   Depression    Diverticulosis    Family history of premature coronary artery disease 04/27/2016   GERD (gastroesophageal reflux disease)    Headache(784.0)    Meningioma ([place]) 04/21/2021   Small meningioma noted on MRI.  This MRI was in April 2022.  Stable from 2013.   Mixed hyperlipidemia    MRI of brain abnormal    Osteoporosis    Sleep apnea    Small vessel disease ([place])    Past Surgical History:  Procedure Laterality Date   ABDOMINAL HYSTERECTOMY     BLADDER SURGERY     tact   CERVICAL SPINE SURGERY     removal of tumor upper neck.   COLONOSCOPY  06/09/2007   [facility]:SEGB papillae otherwise normal   COLONOSCOPY N/A 12/18/2015   Dr.Rourk- colonic diverticulosis, melanosis coli, bx= benign, stool sample= negative   ESOPHAGOGASTRODUODENOSCOPY (EGD) WITH PROPOFOL N/A 04/04/2018   Procedure: ESOPHAGOGASTRODUODENOSCOPY (EGD) WITH PROPOFOL;  Surgeon: Daneil Dolin, MD;  [facility];  Service: Endoscopy;  Laterality: N/A;  8:30am   EYE SURGERY Bilateral 2016   Cataract Surgery Bilateral   MALONEY DILATION N/A 04/04/2018   Procedure: MALONEY DILATION;  Surgeon: Daneil Dolin, MD;  Location: [facility];  Service: Endoscopy;  Laterality: N/A;   MANDIBLE FRACTURE SURGERY     jaw line reduced   Family History  Problem Relation Age of Onset  Coronary artery disease Father        Died with MI age 43   Heart attack Father    Hypertension Mother    Heart failure Mother    Hyperlipidemia Mother    Osteoporosis Mother    Other Mother        bowel issues   Hypertension Sister    Heart disease Brother    Hypertension Brother    Colon cancer Neg Hx    Social History   Socioeconomic History   Marital status: Married    Spouse name: Not on file   Number of children: Not on file   Years of education: Not on file   Highest education level: Not on file  Occupational History   Occupation: Retired    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   Tobacco Use   Smoking status: Never   Smokeless tobacco: Never  Vaping Use   Vaping Use: Never used  Substance and Sexual Activity   Alcohol use: No   Drug use: No   Sexual activity: Yes    Birth control/protection: Surgical  Other Topics Concern   Not on file  Social History Narrative   Not on file   Social Determinants of Health   Financial Resource Strain: Low Risk    Difficulty of Paying Living Expenses: Not hard at all  Food Insecurity: No Food Insecurity   Worried About Charity fundraiser in the Last Year: Never true   [place] in the Last Year: Never true  Transportation Needs: No Transportation Needs   Lack of Transportation (Medical): No   Lack of Transportation (Non-Medical): No  Physical Activity: Sufficiently Active   Days of Exercise per Week: 5 days   Minutes of Exercise per Session: 30 min  Stress: No Stress Concern Present   Feeling of Stress : Not at all  Social Connections: Socially Integrated   Frequency of Communication with Friends and Family: More than three times a week   Frequency of Social Gatherings with Friends and Family: More than three times a week   Attends Religious Services: More than 4 times per year   Active Member of [employer] or Organizations: Yes   Attends Music therapist: More than 4 times per year   Marital Status: Married    Tobacco Counseling Counseling given: Not Answered   Clinical Intake:  Pre-visit preparation completed: Yes  Pain : No/denies pain     BMI - recorded: 26.29 Nutritional Status: BMI 25 -29 Overweight Nutritional Risks: None Diabetes: No  How often do you need to have someone help you when you read instructions, pamphlets, or other written materials from your doctor or pharmacy?: 1 - Never  Diabetic?no  Interpreter Needed?: No  Information entered by :: MJPerdue, LPN   Activities of Daily Living In your present state of health, do you have any difficulty performing the  following activities: 10/07/2021  Hearing? N  Vision? N  Difficulty concentrating or making decisions? Y  Walking or climbing stairs? N  Dressing or bathing? N  Doing errands, shopping? N  Preparing Food and eating ? N  Using the Toilet? N  In the past six months, have you accidently leaked urine? N  Do you have problems with loss of bowel control? N  Managing your Medications? N  Managing your Finances? N  Housekeeping or managing your Housekeeping? N  Some recent data might be hidden    Patient Care Team: Kathyrn Drown, MD as  PCP - General (Family Medicine) Kathyrn Drown, MD (Family Medicine) Gala Romney Cristopher Estimable, MD as Consulting Physician (Gastroenterology) Beryle Lathe, [facility] (Pharmacist)  Indicate any recent Medical Services you may have received from other than [facility] providers in the past year (date may be approximate).     Assessment:   This is a routine wellness examination for Baili.  Hearing/Vision screen Hearing Screening - Comments:: No hearing issues. Vision Screening - Comments:: Readers. 02/2019. [facility].   Dietary issues and exercise activities discussed: Current Exercise Habits: Home exercise routine, Type of exercise: walking, Time (Minutes): 30, Frequency (Times/Week): 5, Weekly Exercise (Minutes/Week): 150, Intensity: Mild, Exercise limited by: Other - see comments (IBS)   Goals Addressed             This Visit's Progress    Exercise 3x per week (30 min per time)         Depression Screen PHQ 2/9 Scores 10/07/2021 09/09/2021 03/06/2021 12/23/2016 07/06/2014  PHQ - 2 Score 0 0 0 0 0    Fall Risk Fall Risk  10/07/2021 09/09/2021 03/06/2021 10/12/2019 10/12/2019  Falls in the past year? 0 0 0 0 0  Number falls in past yr: 0 0 - - -  Injury with Fall? 0 0 - - -  Risk for fall due to : No Fall Risks No Fall Risks - - -  Follow up Falls prevention discussed Falls evaluation completed Falls evaluation completed Falls evaluation completed  Falls evaluation completed    [place]:  Any stairs in or around the home? Yes  If so, are there any without handrails? No  Home free of loose throw rugs in walkways, pet beds, electrical cords, etc? Yes  Adequate lighting in your home to reduce risk of falls? Yes   ASSISTIVE DEVICES UTILIZED TO PREVENT FALLS:  Life alert? No  Use of a cane, walker or w/c? No  Grab bars in the bathroom? No  Shower chair or bench in shower? Yes  Elevated toilet seat or a handicapped toilet? No   TIMED UP AND GO:  Was the test performed? Yes .  Length of time to ambulate 10 feet: 10 sec.   Gait steady and fast without use of assistive device  Cognitive Function:     6CIT Screen 10/07/2021  What Year? 0 points  What month? 0 points  What time? 0 points  Count back from 20 0 points  Months in reverse 0 points  Repeat phrase 0 points  Total Score 0    Immunizations Immunization History  Administered Date(s) Administered   Fluad Quad(high Dose 65+) 12/12/2020, 09/09/2021   Influenza Split 11/15/2013   Influenza,inj,Quad PF,6+ Mos 12/03/2014, 10/29/2015, 12/23/2016, 12/01/2017, 11/29/2018   Influenza-Unspecified 09/27/2012   Moderna Sars-Covid-2 Vaccination 03/11/2020, 04/11/2020   Pneumococcal Conjugate-13 12/03/2014   Pneumococcal Polysaccharide-23 11/15/2013   Zoster, Live 12/02/2012    TDAP status: Due, Education has been provided regarding the importance of this vaccine. Advised may receive this vaccine at local pharmacy or Health Dept. Aware to provide a copy of the vaccination record if obtained from local pharmacy or Health Dept. Verbalized acceptance and understanding.  Flu Vaccine status: Up to date  Pneumococcal vaccine status: Up to date  Covid-19 vaccine status: Information provided on how to obtain vaccines.   Qualifies for Shingles Vaccine? Yes   Zostavax completed Yes   Shingrix Completed?: No.    Education has been provided  regarding the importance of this vaccine.  Patient has been advised to call insurance company to determine out of pocket expense if they have not yet received this vaccine. Advised may also receive vaccine at local pharmacy or Health Dept. Verbalized acceptance and understanding.  Screening Tests Health Maintenance  Topic Date Due   TETANUS/TDAP  Never done   Zoster Vaccines- Shingrix (1 of 2) Never done   COVID-19 Vaccine (3 - Moderna risk series) 05/09/2020   INFLUENZA VACCINE  Completed   DEXA SCAN  Completed   Hepatitis C Screening  Completed   HPV VACCINES  Aged Out    Health Maintenance  Health Maintenance Due  Topic Date Due   TETANUS/TDAP  Never done   Zoster Vaccines- Shingrix (1 of 2) Never done   COVID-19 Vaccine (3 - Moderna risk series) 05/09/2020    Colorectal cancer screening: Type of screening: Colonoscopy. Completed 12/18/2015. Repeat every 10 years  Mammogram status: Completed 02/06/2021. Repeat every year  Bone Density status: Completed 02/06/2021. Results reflect: Bone density results: OSTEOPOROSIS. Repeat every 2 years.  Lung Cancer Screening: (Low Dose CT Chest recommended if Age 95-80 years, 30 pack-year currently smoking OR have quit w/in 15years.) does not qualify.     Additional Screening:  Hepatitis C Screening: does qualify; Completed 08/06/2018  Vision Screening: Recommended annual ophthalmology exams for early detection of glaucoma and other disorders of the eye. Is the patient up to date with their annual eye exam?  No  Who is the provider or what is the name of the office in which the patient attends annual eye exams? [facility] If pt is not established with a provider, would they like to be referred to a provider to establish care? No .   Dental Screening: Recommended annual dental exams for proper oral hygiene  Community Resource Referral / Chronic Care Management: CRR required this visit?  No   CCM required this visit?  No       Plan:     I have personally reviewed and noted the following in the patient's chart:   Medical and social history Use of alcohol, tobacco or illicit drugs  Current medications and supplements including opioid prescriptions. Patient is not currently taking opioid prescriptions. Functional ability and status Nutritional status Physical activity Advanced directives List of other physicians Hospitalizations, surgeries, and ER visits in previous 12 months Vitals Screenings to include cognitive, depression, and falls Referrals and appointments  In addition, I have reviewed and discussed with patient certain preventive protocols, quality metrics, and best practice recommendations. A written personalized care plan for preventive services as well as general preventive health recommendations were provided to patient.     Chriss Driver, LPN   58/59/2924   Nurse Notes: Doing well. Up to date on health maintenance. Discussed covid boosters and new shingles vaccines.

## 2021-10-07 NOTE — Patient Instructions (Signed)
Rachael Matthews , Thank you for taking time to come for your Medicare Wellness Visit. I appreciate your ongoing commitment to your health goals. Please review the following plan we discussed and let me know if I can assist you in the future.   Screening recommendations/referrals: Colonoscopy: Done 12/18/2015 Mammogram: Done  02/06/2021 Bone Density: Done  02/06/2021 Recommended yearly ophthalmology/optometry visit for glaucoma screening and checkup Recommended yearly dental visit for hygiene and checkup  Vaccinations: Influenza vaccine: Done 09/09/2021 Pneumococcal vaccine: Done 11/15/2013 and 12/03/2014 Tdap vaccine: Repeat in 10 years  Shingles vaccine: Zoster done 12/02/2012. Shingrix discussed. Please contact your pharmacy for coverage information.     Covid-19:[place] 04/01/20 and 03/11/20  Advanced directives: Advance directive discussed with you today. Even though you declined this today, please call our office should you change your mind, and we can give you the proper paperwork for you to fill out.   Conditions/risks identified: Aim for 30 minutes of exercise or walking each day, drink 6-8 glasses of water and eat lots of fruits and vegetables.   Next appointment: Follow up in one year for your annual wellness visit 2023   Preventive Care 65 Years and Older, Female Preventive care refers to lifestyle choices and visits with your health care provider that can promote health and wellness. What does preventive care include? A yearly physical exam. This is also called an annual well check. Dental exams once or twice a year. Routine eye exams. Ask your health care provider how often you should have your eyes checked. Personal lifestyle choices, including: Daily care of your teeth and gums. Regular physical activity. Eating a healthy diet. Avoiding tobacco and drug use. Limiting alcohol use. Practicing safe sex. Taking low-dose aspirin every day. Taking vitamin and mineral supplements as  recommended by your health care provider. What happens during an annual well check? The services and screenings done by your health care provider during your annual well check will depend on your age, overall health, lifestyle risk factors, and family history of disease. Counseling  Your health care provider may ask you questions about your: Alcohol use. Tobacco use. Drug use. Emotional well-being. Home and relationship well-being. Sexual activity. Eating habits. History of falls. Memory and ability to understand (cognition). Work and work Statistician. Reproductive health. Screening  You may have the following tests or measurements: Height, weight, and BMI. Blood pressure. Lipid and cholesterol levels. These may be checked every 5 years, or more frequently if you are over 18 years old. Skin check. Lung cancer screening. You may have this screening every year starting at age 64 if you have a 30-pack-year history of smoking and currently smoke or have quit within the past 15 years. Fecal occult blood test (FOBT) of the stool. You may have this test every year starting at age 45. Flexible sigmoidoscopy or colonoscopy. You may have a sigmoidoscopy every 5 years or a colonoscopy every 10 years starting at age 60. Hepatitis C blood test. Hepatitis B blood test. Sexually transmitted disease (STD) testing. Diabetes screening. This is done by checking your blood sugar (glucose) after you have not eaten for a while (fasting). You may have this done every 1-3 years. Bone density scan. This is done to screen for osteoporosis. You may have this done starting at age 45. Mammogram. This may be done every 1-2 years. Talk to your health care provider about how often you should have regular mammograms. Talk with your health care provider about your test results, treatment options, and if necessary,  the need for more tests. Vaccines  Your health care provider may recommend certain vaccines, such  as: Influenza vaccine. This is recommended every year. Tetanus, diphtheria, and acellular pertussis (Tdap, Td) vaccine. You may need a Td booster every 10 years. Zoster vaccine. You may need this after age 6. Pneumococcal 13-valent conjugate (PCV13) vaccine. One dose is recommended after age 58. Pneumococcal polysaccharide (PPSV23) vaccine. One dose is recommended after age 62. Talk to your health care provider about which screenings and vaccines you need and how often you need them. This information is not intended to replace advice given to you by your health care provider. Make sure you discuss any questions you have with your health care provider. Document Released: 01/10/2016 Document Revised: 09/02/2016 Document Reviewed: 10/15/2015 Elsevier Interactive Patient Education  2017 [place] Prevention in the Home Falls can cause injuries. They can happen to people of all ages. There are many things you can do to make your home safe and to help prevent falls. What can I do on the outside of my home? Regularly fix the edges of walkways and driveways and fix any cracks. Remove anything that might make you trip as you walk through a door, such as a raised step or threshold. Trim any bushes or trees on the path to your home. Use bright outdoor lighting. Clear any walking paths of anything that might make someone trip, such as rocks or tools. Regularly check to see if handrails are loose or broken. Make sure that both sides of any steps have handrails. Any raised decks and porches should have guardrails on the edges. Have any leaves, snow, or ice cleared regularly. Use sand or salt on walking paths during winter. Clean up any spills in your garage right away. This includes oil or grease spills. What can I do in the bathroom? Use night lights. Install grab bars by the toilet and in the tub and shower. Do not use towel bars as grab bars. Use non-skid mats or decals in the tub or  shower. If you need to sit down in the shower, use a plastic, non-slip stool. Keep the floor dry. Clean up any water that spills on the floor as soon as it happens. Remove soap buildup in the tub or shower regularly. Attach bath mats securely with double-sided non-slip rug tape. Do not have throw rugs and other things on the floor that can make you trip. What can I do in the bedroom? Use night lights. Make sure that you have a light by your bed that is easy to reach. Do not use any sheets or blankets that are too big for your bed. They should not hang down onto the floor. Have a firm chair that has side arms. You can use this for support while you get dressed. Do not have throw rugs and other things on the floor that can make you trip. What can I do in the kitchen? Clean up any spills right away. Avoid walking on wet floors. Keep items that you use a lot in easy-to-reach places. If you need to reach something above you, use a strong step stool that has a grab bar. Keep electrical cords out of the way. Do not use floor polish or wax that makes floors slippery. If you must use wax, use non-skid floor wax. Do not have throw rugs and other things on the floor that can make you trip. What can I do with my stairs? Do not leave any items on  the stairs. Make sure that there are handrails on both sides of the stairs and use them. Fix handrails that are broken or loose. Make sure that handrails are as long as the stairways. Check any carpeting to make sure that it is firmly attached to the stairs. Fix any carpet that is loose or worn. Avoid having throw rugs at the top or bottom of the stairs. If you do have throw rugs, attach them to the floor with carpet tape. Make sure that you have a light switch at the top of the stairs and the bottom of the stairs. If you do not have them, ask someone to add them for you. What else can I do to help prevent falls? Wear shoes that: Do not have high heels. Have  rubber bottoms. Are comfortable and fit you well. Are closed at the toe. Do not wear sandals. If you use a stepladder: Make sure that it is fully opened. Do not climb a closed stepladder. Make sure that both sides of the stepladder are locked into place. Ask someone to hold it for you, if possible. Clearly mark and make sure that you can see: Any grab bars or handrails. First and last steps. Where the edge of each step is. Use tools that help you move around (mobility aids) if they are needed. These include: Canes. Walkers. Scooters. Crutches. Turn on the lights when you go into a dark area. Replace any light bulbs as soon as they burn out. Set up your furniture so you have a clear path. Avoid moving your furniture around. If any of your floors are uneven, fix them. If there are any pets around you, be aware of where they are. Review your medicines with your doctor. Some medicines can make you feel dizzy. This can increase your chance of falling. Ask your doctor what other things that you can do to help prevent falls. This information is not intended to replace advice given to you by your health care provider. Make sure you discuss any questions you have with your health care provider. Document Released: 10/10/2009 Document Revised: 05/21/2016 Document Reviewed: 01/18/2015 Elsevier Interactive Patient Education  2017 [employer].

## 2021-10-07 NOTE — Telephone Encounter (Signed)
Pt here for AWV today. She wanted to let you know that Dr. Jodelle Red was unable to do her Prolia injection, due to the pt being on Vitamin D Rx. Dr. Jodelle Red advised the patient to finish the Vitamin D, which she has 4 more weeks of, recheck her Vitamin D level with you an then come back to see her. Thank you.

## 2021-10-08 NOTE — Telephone Encounter (Signed)
Spoke with patient to inform her of the doctors recommendations for vitamin d management .

## 2021-10-08 NOTE — Telephone Encounter (Signed)
Patient should finish out the dosing of her vitamin D after she is finished with the prescription vitamin D she should go to OTC vitamin D 2000 units daily, repeat vitamin D level 1 week after finishing the prescription vitamin D, may order test please

## 2021-10-21 ENCOUNTER — Telehealth: Payer: Self-pay | Admitting: Family Medicine

## 2021-10-21 NOTE — Telephone Encounter (Signed)
Patient called to speak with a nurse in regards to what her insurance would pay with new mediation prescribed/recommended for her. Wanted to speak to a nurse personally. Please advise.  CB#  [phone]

## 2021-10-22 NOTE — Telephone Encounter (Signed)
Patient call is referring to Rachael Matthews, pharmacist recommended she restart it , she would like to let her doctor know [facility] will be contacting the office to get medication restarted.

## 2021-10-22 NOTE — Telephone Encounter (Signed)
I am not quite sure what is meant by [facility] contacting us? Please also have Gerald Stabs involved with this given that this is more of a clinical pharmacist issue thank you

## 2021-10-24 ENCOUNTER — Ambulatory Visit (INDEPENDENT_AMBULATORY_CARE_PROVIDER_SITE_OTHER): Payer: Medicare HMO | Admitting: Pharmacist

## 2021-10-24 DIAGNOSIS — E782 Mixed hyperlipidemia: Secondary | ICD-10-CM

## 2021-10-24 DIAGNOSIS — Z789 Other specified health status: Secondary | ICD-10-CM

## 2021-10-24 DIAGNOSIS — M810 Age-related osteoporosis without current pathological fracture: Secondary | ICD-10-CM

## 2021-10-24 DIAGNOSIS — Z8249 Family history of ischemic heart disease and other diseases of the circulatory system: Secondary | ICD-10-CM

## 2021-10-24 MED ORDER — PRALUENT 75 MG/ML ~~LOC~~ SOAJ: 75.0000 mg | SUBCUTANEOUS | 11 refills | Status: DC

## 2021-10-24 NOTE — Chronic Care Management (AMB) (Signed)
Chronic Care Management Pharmacy Note  10/24/2021 Name:  Rachael Matthews MRN:  [id number] DOB:  [birth year]  Summary:  Hyperlipidemia: Reviewed risks of hyperlipidemia, principles of treatment and consequences of untreated hyperlipidemia Statin intolerance noted. No statin due to serious side effects (ex. Myalgias with at least 2 different statins) Received fax from patient's insurance company that [place] is preferred and will need a prior authorization. Will move forward with Praluent 75 mg subcutaneously every 14 days and submit prior authorization. Patient reports that she plans to start a new job so she will have enough income to pay to continue Praluent even during the coverage gap.  PA generated via CoverMyMeds. KeyChancy Milroy - PA Case ID: [id number] Can consider PAN foundation for assistance if lipid fund reopens.   Subjective: Rachael Matthews is an 75 y.o. year old female who is a primary patient of Luking, Elayne Snare, MD.  The CCM team was consulted for assistance with disease management and care coordination needs.    Engaged with patient by telephone for follow up visit in response to provider referral for pharmacy case management and/or care coordination services.   Consent to Services:  The patient was given information about Chronic Care Management services, agreed to services, and gave verbal consent prior to initiation of services.  Please see initial visit note for detailed documentation.   Patient Care Team: Kathyrn Drown, MD as PCP - General (Family Medicine) Kathyrn Drown, MD (Family Medicine) Gala Romney Cristopher Estimable, MD as Consulting Physician (Gastroenterology) Beryle Lathe, [facility] (Pharmacist)  Objective:  Lab Results  Component Value Date   CREATININE 0.72 09/09/2021   CREATININE 0.73 02/28/2021   CREATININE 0.79 07/25/2020    No results found for: HGBA1C Last diabetic Eye exam: No results found for: HMDIABEYEEXA  Last diabetic Foot exam: No results  found for: HMDIABFOOTEX      Component Value Date/Time   CHOL 325 (H) 02/28/2021 0859   TRIG 131 02/28/2021 0859   HDL 46 02/28/2021 0859   CHOLHDL 7.1 (H) 02/28/2021 0859   CHOLHDL 2.6 07/01/2016 0846   VLDL 24 07/01/2016 0846   LDLCALC 255 (H) 02/28/2021 0859    Hepatic Function Latest Ref Rng & Units 07/25/2020 08/06/2018 11/11/2017  Total Protein 6.0 - 8.5 g/dL 7.1 6.6 6.9  Albumin 3.7 - 4.7 g/dL 4.4 4.1 3.9  AST 0 - 40 IU/L $Remov'20 18 20  'jzFkgr$ ALT 0 - 32 IU/L $Remov'10 12 10  'XHOypg$ Alk Phosphatase 48 - 121 IU/L 58 46 48  Total Bilirubin 0.0 - 1.2 mg/dL 0.5 0.4 0.4  Bilirubin, Direct 0.00 - 0.40 mg/dL - 0.10 0.09    Lab Results  Component Value Date/Time   TSH 2.620 08/08/2020 10:03 AM   TSH 2.740 09/06/2018 09:04 AM   FREET4 0.97 08/08/2020 10:03 AM   FREET4 1.11 09/06/2018 09:04 AM    CBC Latest Ref Rng & Units 08/08/2020 09/06/2018 03/28/2018  WBC 3.4 - 10.8 x10E3/uL 8.7 8.1 7.5  Hemoglobin 11.1 - 15.9 g/dL 14.6 14.1 14.0  Hematocrit 34.0 - 46.6 % 45.3 41.5 43.3  Platelets 150 - 450 x10E3/uL 307 342 269    Lab Results  Component Value Date/Time   VD25OH 20.2 (L) 09/09/2021 11:30 AM    Clinical ASCVD: No  The ASCVD Risk score (Arnett DK, et al., 2019) failed to calculate for the following reasons:   The valid total cholesterol range is 130 to 320 mg/dL    Social History   Tobacco Use  Smoking  Status Never  Smokeless Tobacco Never   BP Readings from Last 3 Encounters:  76/11/22 (!) 142/84  09/09/21 130/82  03/06/21 126/82   Pulse Readings from Last 3 Encounters:  10/07/21 68  09/09/21 84  08/12/18 76   Wt Readings from Last 3 Encounters:  10/07/21 132 lb 6.4 oz (60.1 kg)  09/09/21 130 lb (59 kg)  03/06/21 127 lb (57.6 kg)    Assessment: Review of patient past medical history, allergies, medications, health status, including review of consultants reports, laboratory and other test data, was performed as part of comprehensive evaluation and provision of chronic care  management services.   SDOH:  (Social Determinants of Health) assessments and interventions performed:    CCM Care Plan  Allergies  Allergen Reactions   Codeine Other (See Comments)    Jittery and unable to sleep   Reclast [Zoledronic Acid]     Joint and muscle pain and fatigue for 1 week after infusion   Statins Other (See Comments)    "Cant remember how to do anything"   Fosamax [Alendronate Sodium] Other (See Comments)    esophagitis    Medications Reviewed Today     Reviewed by Beryle Lathe, [facility] (Pharmacist) on 10/24/21 at 1156  Med List Status: <None>   Medication Order Taking? Sig Documenting Provider Last Dose Status Informant  calcium carbonate (TUMS - DOSED IN MG ELEMENTAL CALCIUM) 500 MG chewable tablet [id number] Yes Chew 1 tablet by mouth daily as needed for indigestion or heartburn. [provider] Taking Active Self  famotidine (PEPCID) 40 MG tablet [id number] Yes TAKE 1 TABLET BY MOUTH DAILY  Patient taking differently: Take 40 mg by mouth every evening.   Kathyrn Drown, MD Taking Active Self  fexofenadine (ALLEGRA) 180 MG tablet [id number] Yes Take 180 mg by mouth in the morning. [provider] Taking Active Self  Lactase (LACTAID PO) [id number] Yes Take 1 tablet by mouth daily as needed (for lactose).  [provider] Taking Active Self  naproxen sodium (ALEVE) 220 MG tablet [id number] Yes Take 220 mg by mouth daily as needed. [provider] Taking Active Self  Polyethyl Glycol-Propyl Glycol 0.4-0.3 % SOLN [id number] Yes Place 1 drop into both eyes 2 (two) times daily as needed. [provider] Taking Active   topiramate (TOPAMAX) 25 MG tablet [id number] Yes TAKE 1 TABLET BY MOUTH TWICE DAILY Luking, Elayne Snare, MD Taking Active            Med Note [name] Sep 22, 2021 10:42 AM) Dewaine Conger once a day normally except when she feels a trigger (weather related) coming on then she increases to twice daily    Vitamin D, Ergocalciferol, (DRISDOL) 1.25 MG (50000 UNIT) CAPS capsule [id number] Yes Take one capsule po once weekly for 8 weeks Kathyrn Drown, MD Taking Active             Patient Active Problem List   Diagnosis Date Noted   Meningioma ([place]) 04/21/2021   Referred otalgia of right ear 08/01/2020   Sensorineural hearing loss (SNHL), bilateral 08/01/2020   Temporomandibular jaw dysfunction 08/01/2020   Family history of premature coronary artery disease 04/27/2016   Diverticulosis of colon without hemorrhage    Irritable bowel syndrome with diarrhea 12/10/2015   Migraine 08/12/2015   GERD (gastroesophageal reflux disease) 07/06/2014   Osteoporosis 11/19/2013   Hyperlipemia 07/11/2013   Other nonspecific abnormal result of function study of brain and central nervous system 02/20/2013  Shortness of breath 07/31/2011    Immunization History  Administered Date(s) Administered   Fluad Quad(high Dose 65+) 12/12/2020, 09/09/2021   Influenza Split 11/15/2013   Influenza,inj,Quad PF,6+ Mos 12/03/2014, 10/29/2015, 12/23/2016, 12/01/2017, 11/29/2018   Influenza-Unspecified 09/27/2012   Moderna Sars-Covid-2 Vaccination 03/11/2020, 04/11/2020   Pneumococcal Conjugate-13 12/03/2014   Pneumococcal Polysaccharide-23 11/15/2013   Zoster, Live 12/02/2012    Conditions to be addressed/monitored: HLD and Osteoporosis  Care Plan : Medication Management  Updates made by Beryle Lathe, [place] since 10/24/2021 12:00 AM     Problem: Hyperlipidemia and Osteoporosis   Priority: High  Onset Date: 09/22/2021     Long-Range Goal: Disease Progression Prevention   Start Date: 09/22/2021  Expected End Date: 12/21/2021  Recent Progress: On track  Priority: High  Note:   Current Barriers:  Unable to achieve control of hyperlipidemia and osteoporosis Suboptimal therapeutic regimen for hyperlipidemia  Pharmacist Clinical Goal(s):  Over the next 90 days, patient will Achieve control of  hyperlipidemia and osteoporosis as evidenced by improved LDL and improved bone mineral density through collaboration with PharmD and provider.   Interventions: 1:1 collaboration with Kathyrn Drown, MD regarding development and update of comprehensive plan of care as evidenced by provider attestation and co-signature Inter-disciplinary care team collaboration (see longitudinal plan of care) Comprehensive medication review performed; medication list updated in electronic medical record  Hyperlipidemia: Uncontrolled. LDL above goal of <70 due to very high risk given cerebral small vessel disease and family history of cardiovascular disease per 2020 AACE/ACE guidelines. Triglycerides at goal of <150 per 2020 AACE/ACE guidelines. Patient has not been formally diagnosed with heterozygous or homozygous familial hypercholesterolemia but suspicion present given family history of cardiovascular disease and extremely elevated LDL levels without large body habitus Patient has a history of taking Repatha and Praluent which she tolerated and were effective. LDL was decreased to 61. However, she has been unable to afford and has not been on these medications in quite some time. LDL now rebounded back to >200.  Patient workup completed for Praluent patient assistance program; however, the patient does not qualify based on household size and income. She would have to pay the co-pay through her insurance which has been challenging since she reaches the coverage gap relatively quickly in the year and is unable to continue to pay the co-pay. Current medications:  none Intolerances:  Statins and ezetimibe (memory impairment) Taking medications as directed: n/a Side effects thought to be attributed to current medication regimen: n/a Reviewed risks of hyperlipidemia, principles of treatment and consequences of untreated hyperlipidemia Statin intolerance noted. No statin due to serious side effects (ex. Myalgias with at  least 2 different statins) Received fax from patient's insurance company that [place] is preferred and will need a prior authorization. Will move forward with Praluent 75 mg subcutaneously every 14 days and submit prior authorization.  Can consider PAN foundation for assistance if lipid fund reopens.   Osteoporosis: Appropriately managed; referral placed to [facility] to initiate Prolia (denosumab) 60 mg subcutaneously every 6 months Current medications:  vitamin D 50,000 units once weekly Intolerances:  alendronate (esophagitis) and zoledronic acid (fatigue and muscle/joint pain 1 week after infusion) Last zoledronic acid (Reclast) infusion in 2019 Taking medications as directed: yes Side effects thought to be attributed to current medication regimen: no Last DEXA (02/06/21): T score = -3.1 R femur neck and -2.8 L femur neck Falls reported in last year: 0 Current exercise: not discussed today Add denosumab (Prolia) 60 mg subcutaneously  every 6 months Recommend 1,000 mg of elemental calcium per day from diet or supplementation. Supplemental calcium not necessary if appropriate amount obtained through diet. Calcium citrate supplementation preferred due to chronic gastric acid-suppressive therapy Recommend 800 - 2,000 IU of vitamin D supplementation per day to maintain adequate vitamin D levels once course of high dose vitamin D completed to resolve low vitamin D level Recommend walking, low impact aerobic exercise, or strength training  Patient Goals/Self-Care Activities Over the next 90 days, patient will:  Take medications as prescribed Collaborate with provider on medication access solutions Start taking calcium citrate + vitamin D3 twice daily with food for osteoporosis   Follow Up Plan: Telephone follow up appointment with care management team member scheduled for: 12/12/21      Medication Assistance: None required.  Patient affirms current coverage meets  needs.  Patient's preferred pharmacy is:  [id number] - [place],  - [address] AT [place]. [place] [address] Phone: [phone] Fax: [place], [place] [address] [place] [address] Phone: [phone] Fax: [phone]  Follow Up:  Patient agrees to Care Plan and Follow-up.  Plan: Telephone follow up appointment with care management team member scheduled for:  12/12/21  Kennon Holter, PharmD Clinical Pharmacist [place] [phone]

## 2021-10-24 NOTE — Patient Instructions (Signed)
Rachael Matthews,  It was great to talk to you today! We will send an prescription to your mail order pharmacy for Rachael Matthews. I will submit the prior authorization paperwork necessary to get it approved.  Please call me with any questions or concerns.   Visit Information  PATIENT GOALS:  Goals Addressed             This Visit's Progress    Medication Management       Patient Goals/Self-Care Activities Over the next 90 days, patient will:  Take medications as prescribed Collaborate with provider on medication access solutions Start taking calcium citrate + vitamin D3 twice daily with food for osteoporosis           Patient verbalizes understanding of instructions provided today and agrees to view in [place].   Telephone follow up appointment with care management team member scheduled for:12/12/21  Kennon Holter, PharmD Clinical Pharmacist [place] [phone]

## 2021-10-27 ENCOUNTER — Ambulatory Visit: Payer: Medicare HMO | Admitting: Pharmacist

## 2021-10-27 DIAGNOSIS — Z8249 Family history of ischemic heart disease and other diseases of the circulatory system: Secondary | ICD-10-CM

## 2021-10-27 DIAGNOSIS — M810 Age-related osteoporosis without current pathological fracture: Secondary | ICD-10-CM | POA: Diagnosis not present

## 2021-10-27 DIAGNOSIS — E782 Mixed hyperlipidemia: Secondary | ICD-10-CM

## 2021-10-27 DIAGNOSIS — Z789 Other specified health status: Secondary | ICD-10-CM

## 2021-10-27 MED ORDER — PRALUENT 75 MG/ML ~~LOC~~ SOAJ: 75.0000 mg | SUBCUTANEOUS | 11 refills | Status: DC

## 2021-10-27 NOTE — Chronic Care Management (AMB) (Signed)
Chronic Care Management Pharmacy Note  10/27/2021 Name:  Rachael Matthews MRN:  [id number] DOB:  08-Jun-1946  Summary:  Received fax from patient's insurance company that [place] prior authorization was denied. Appeal complete vie telephone. Prior authorization approved through 12/27/22. Called mail order pharmacy to make sure claim was able to go through; however, they informed me that they no longer fill Praluent and that the patient would need to get it from a local pharmacy. Called patient and patient would like to receive it from [facility] in [place]. Rx cancelled with mail order pharmacy and sent to [facility]. Patient notified and plans to start Praluent 75 mg subcutaneously every 14 days.  Subjective: Rachael Matthews is an 75 y.o. year old female who is a primary patient of Luking, Elayne Snare, MD.  The CCM team was consulted for assistance with disease management and care coordination needs.    Engaged with patient by telephone for follow up visit in response to provider referral for pharmacy case management and/or care coordination services.   Consent to Services:  The patient was given information about Chronic Care Management services, agreed to services, and gave verbal consent prior to initiation of services.  Please see initial visit note for detailed documentation.   Patient Care Team: Kathyrn Drown, MD as PCP - General (Family Medicine) Kathyrn Drown, MD (Family Medicine) Gala Romney Cristopher Estimable, MD as Consulting Physician (Gastroenterology) Beryle Lathe, [facility] (Pharmacist)  Objective:  Lab Results  Component Value Date   CREATININE 0.72 09/09/2021   CREATININE 0.73 02/28/2021   CREATININE 0.79 07/25/2020    No results found for: HGBA1C Last diabetic Eye exam: No results found for: HMDIABEYEEXA  Last diabetic Foot exam: No results found for: HMDIABFOOTEX      Component Value Date/Time   CHOL 325 (H) 02/28/2021 0859   TRIG 131 02/28/2021 0859   HDL 46  02/28/2021 0859   CHOLHDL 7.1 (H) 02/28/2021 0859   CHOLHDL 2.6 07/01/2016 0846   VLDL 24 07/01/2016 0846   LDLCALC 255 (H) 02/28/2021 0859    Hepatic Function Latest Ref Rng & Units 07/25/2020 08/06/2018 11/11/2017  Total Protein 6.0 - 8.5 g/dL 7.1 6.6 6.9  Albumin 3.7 - 4.7 g/dL 4.4 4.1 3.9  AST 0 - 40 IU/L _0 ALT 0 - 32 IU/L _1 Alk Phosphatase 48 - 121 IU/L 58 46 48  Total Bilirubin 0.0 - 1.2 mg/dL 0.5 0.4 0.4  Bilirubin, Direct 0.00 - 0.40 mg/dL - 0.10 0.09    Lab Results  Component Value Date/Time   TSH 2.620 08/08/2020 10:03 AM   TSH 2.740 09/06/2018 09:04 AM   FREET4 0.97 08/08/2020 10:03 AM   FREET4 1.11 09/06/2018 09:04 AM    CBC Latest Ref Rng & Units 08/08/2020 09/06/2018 03/28/2018  WBC 3.4 - 10.8 x10E3/uL 8.7 8.1 7.5  Hemoglobin 11.1 - 15.9 g/dL 14.6 14.1 14.0  Hematocrit 34.0 - 46.6 % 45.3 41.5 43.3  Platelets 150 - 450 x10E3/uL 307 342 269    Lab Results  Component Value Date/Time   VD25OH 20.2 (L) 09/09/2021 11:30 AM    Clinical ASCVD: No  The ASCVD Risk score (Arnett DK, et al., 2019) failed to calculate for the following reasons:   The valid total cholesterol range is 130 to 320 mg/dL    Social History   Tobacco Use  Smoking Status Never  Smokeless Tobacco Never   BP Readings from Last 3 Encounters:  10/07/21 (!) 142/84  09/09/21 130/82  03/06/21 126/82   Pulse Readings from Last 3 Encounters:  10/07/21 68  09/09/21 84  08/12/18 76   Wt Readings from Last 3 Encounters:  10/07/21 132 lb 6.4 oz (60.1 kg)  09/09/21 130 lb (59 kg)  03/06/21 127 lb (57.6 kg)    Assessment: Review of patient past medical history, allergies, medications, health status, including review of consultants reports, laboratory and other test data, was performed as part of comprehensive evaluation and provision of chronic care management services.   SDOH:  (Social Determinants of Health) assessments and interventions performed:    CCM Care  Plan  Allergies  Allergen Reactions   Codeine Other (See Comments)    Jittery and unable to sleep   Reclast [Zoledronic Acid]     Joint and muscle pain and fatigue for 1 week after infusion   Statins Other (See Comments)    "Cant remember how to do anything"   Fosamax [Alendronate Sodium] Other (See Comments)    esophagitis    Medications Reviewed Today     Reviewed by Beryle Lathe, [facility] (Pharmacist) on 10/27/21 at [place] List Status: <None>   Medication Order Taking? Sig Documenting Provider Last Dose Status Informant  Alirocumab (PRALUENT) 75 MG/ML SOAJ [id number] No Inject 75 mg into the skin every 14 (fourteen) days.  Patient not taking: Reported on 10/27/2021   Kathyrn Drown, MD Not Taking Active   calcium carbonate (TUMS - DOSED IN MG ELEMENTAL CALCIUM) 500 MG chewable tablet [id number] Yes Chew 1 tablet by mouth daily as needed for indigestion or heartburn. [provider] Taking Active Self  famotidine (PEPCID) 40 MG tablet [id number] Yes TAKE 1 TABLET BY MOUTH DAILY  Patient taking differently: Take 40 mg by mouth every evening.   Kathyrn Drown, MD Taking Active Self  fexofenadine (ALLEGRA) 180 MG tablet [id number] Yes Take 180 mg by mouth in the morning. [provider] Taking Active Self  Lactase (LACTAID PO) [id number] Yes Take 1 tablet by mouth daily as needed (for lactose).  [provider] Taking Active Self  naproxen sodium (ALEVE) 220 MG tablet [id number] Yes Take 220 mg by mouth daily as needed. [provider] Taking Active Self  Polyethyl Glycol-Propyl Glycol 0.4-0.3 % SOLN [id number] Yes Place 1 drop into both eyes 2 (two) times daily as needed. [provider] Taking Active   topiramate (TOPAMAX) 25 MG tablet [id number] Yes TAKE 1 TABLET BY MOUTH TWICE DAILY Luking, Elayne Snare, MD Taking Active            Med Note [name] Sep 22, 2021 10:42 AM) Dewaine Conger once a day normally except when she feels  a trigger (weather related) coming on then she increases to twice daily   Vitamin D, Ergocalciferol, (DRISDOL) 1.25 MG (50000 UNIT) CAPS capsule [id number] Yes Take one capsule po once weekly for 8 weeks Kathyrn Drown, MD Taking Active             Patient Active Problem List   Diagnosis Date Noted   Meningioma ([place]) 04/21/2021   Referred otalgia of right ear 08/01/2020   Sensorineural hearing loss (SNHL), bilateral 08/01/2020   Temporomandibular jaw dysfunction 08/01/2020   Family history of premature coronary artery disease 04/27/2016   Diverticulosis of colon without hemorrhage    Irritable bowel syndrome with diarrhea 12/10/2015   Migraine 08/12/2015   GERD (gastroesophageal reflux disease) 07/06/2014   Osteoporosis 11/19/2013   Hyperlipemia 07/11/2013  Other nonspecific abnormal result of function study of brain and central nervous system 02/20/2013   Shortness of breath 07/31/2011    Immunization History  Administered Date(s) Administered   Fluad Quad(high Dose 65+) 12/12/2020, 09/09/2021   Influenza Split 11/15/2013   Influenza,inj,Quad PF,6+ Mos 12/03/2014, 10/29/2015, 12/23/2016, 12/01/2017, 11/29/2018   Influenza-Unspecified 09/27/2012   Moderna Sars-Covid-2 Vaccination 03/11/2020, 04/11/2020   Pneumococcal Conjugate-13 12/03/2014   Pneumococcal Polysaccharide-23 11/15/2013   Zoster, Live 12/02/2012    Conditions to be addressed/monitored: HLD and Osteoporosis  Care Plan : Medication Management  Updates made by Beryle Lathe, [place] since 10/27/2021 12:00 AM     Problem: Hyperlipidemia and Osteoporosis   Priority: High  Onset Date: 09/22/2021     Long-Range Goal: Disease Progression Prevention   Start Date: 09/22/2021  Expected End Date: 12/21/2021  Recent Progress: On track  Priority: High  Note:   Current Barriers:  Unable to achieve control of hyperlipidemia and osteoporosis Suboptimal therapeutic regimen for hyperlipidemia  Pharmacist  Clinical Goal(s):  Over the next 90 days, patient will Achieve control of hyperlipidemia and osteoporosis as evidenced by improved LDL and improved bone mineral density through collaboration with PharmD and provider.   Interventions: 1:1 collaboration with Kathyrn Drown, MD regarding development and update of comprehensive plan of care as evidenced by provider attestation and co-signature Inter-disciplinary care team collaboration (see longitudinal plan of care) Comprehensive medication review performed; medication list updated in electronic medical record  Hyperlipidemia: Uncontrolled. LDL above goal of <70 due to very high risk given cerebral small vessel disease and family history of cardiovascular disease per 2020 AACE/ACE guidelines. Triglycerides at goal of <150 per 2020 AACE/ACE guidelines. Patient has not been formally diagnosed with heterozygous or homozygous familial hypercholesterolemia but suspicion present given family history of cardiovascular disease and extremely elevated LDL levels without large body habitus Patient has a history of taking Repatha and Praluent which she tolerated and were effective. LDL was decreased to 61. However, she has been unable to afford and has not been on these medications in quite some time. LDL now rebounded back to >200.  Patient workup completed for Praluent patient assistance program; however, the patient does not qualify based on household size and income. She would have to pay the co-pay through her insurance which has been challenging since she reaches the coverage gap relatively quickly in the year and is unable to continue to pay the co-pay. Current medications:  none Intolerances:  Statins and ezetimibe (memory impairment) Taking medications as directed: n/a Side effects thought to be attributed to current medication regimen: n/a Reviewed risks of hyperlipidemia, principles of treatment and consequences of untreated hyperlipidemia Statin  intolerance noted. No statin due to serious side effects (ex. Myalgias with at least 2 different statins) Received fax from patient's insurance company that [place] prior authorization was denied. Appeal complete vie telephone. Prior authorization approved through 12/27/22. Called mail order pharmacy to make sure claim was able to go through; however, they informed me that they no longer fill Praluent and that the patient would need to get it from a local pharmacy. Called patient and patient would like to receive it from [facility] in [place]. Rx cancelled with mail order pharmacy and sent to [facility]. Patient notified and plans to start Praluent 75 mg subcutaneously every 14 days. Can consider [employer] for assistance if lipid fund reopens.   Osteoporosis: Appropriately managed; referral placed to [facility] to initiate Prolia (denosumab) 60 mg subcutaneously every 6 months Current medications:  vitamin D 50,000 units once weekly Intolerances:  alendronate (esophagitis) and zoledronic acid (fatigue and muscle/joint pain 1 week after infusion) Last zoledronic acid (Reclast) infusion in 2019 Taking medications as directed: yes Side effects thought to be attributed to current medication regimen: no Last DEXA (02/06/21): T score = -3.1 R femur neck and -2.8 L femur neck Falls reported in last year: 0 Current exercise: not discussed today Add denosumab (Prolia) 60 mg subcutaneously every 6 months Recommend 1,000 mg of elemental calcium per day from diet or supplementation. Supplemental calcium not necessary if appropriate amount obtained through diet. Calcium citrate supplementation preferred due to chronic gastric acid-suppressive therapy Recommend 800 - 2,000 IU of vitamin D supplementation per day to maintain adequate vitamin D levels once course of high dose vitamin D completed to resolve low vitamin D level Recommend walking, low impact aerobic exercise, or strength  training  Patient Goals/Self-Care Activities Over the next 90 days, patient will:  Take medications as prescribed Collaborate with provider on medication access solutions Start taking calcium citrate + vitamin D3 twice daily with food for osteoporosis   Follow Up Plan: Telephone follow up appointment with care management team member scheduled for: 12/12/21      Medication Assistance: None required.  Patient affirms current coverage meets needs.  Patient's preferred pharmacy is:  [id number] - [place], [place] - [address] AT [place]. [place] [address] Phone: [phone] Fax: [place], [place] [address] [place] [address] Phone: [phone] Fax: [phone]  Follow Up:  Patient agrees to Care Plan and Follow-up.  Plan: Telephone follow up appointment with care management team member scheduled for:  12/12/21  Kennon Holter, PharmD Clinical Pharmacist [place] [phone]

## 2021-10-27 NOTE — Patient Instructions (Signed)
Rachael Matthews,  It was great to talk to you today! I sent your Praluent prescription to [facility] in [place].   Please call me with any questions or concerns.   Visit Information  PATIENT GOALS:  Goals Addressed             This Visit's Progress    Medication Management       Patient Goals/Self-Care Activities Over the next 90 days, patient will:  Take medications as prescribed Collaborate with provider on medication access solutions Start taking Praluent 75 mg subcutaneously every 14 days for hyperlipidemia to lower your risk of having a heart attack or stroke           Patient verbalizes understanding of instructions provided today and agrees to view in [place].   Telephone follow up appointment with care management team member scheduled for:12/12/21  Kennon Holter, PharmD Clinical Pharmacist [place] [phone]

## 2021-11-05 ENCOUNTER — Telehealth: Payer: Self-pay | Admitting: Family Medicine

## 2021-11-05 NOTE — Telephone Encounter (Signed)
Patient wanting to know if she had to fast for her labs. Please advise

## 2021-11-05 NOTE — Telephone Encounter (Signed)
Patient wanted to know if she needed to be fasting for vit d blood work. Patient advised she does not need to be fasting for vit d blood work -Patient verbalized understanding.

## 2021-11-06 ENCOUNTER — Telehealth: Payer: Self-pay | Admitting: Family Medicine

## 2021-11-06 DIAGNOSIS — E559 Vitamin D deficiency, unspecified: Secondary | ICD-10-CM

## 2021-11-06 NOTE — Telephone Encounter (Signed)
Patient came to front window this morning, She is getting a shot for osteoporosis (Dr. Chalmers Cater) next week and was told that she cannot have the shot unless her vitamin D levels we back up where they were supposed to be. She was under the impression that her labs would be at [facility] today. Stated the Dr. Nicki Reaper said to wait one week after taking last vitamin D to get tested again. Patient is requesting to get her vitamin D checked ASAP so she can get her shot. Please advise.  Pt#:  [phone]

## 2021-11-06 NOTE — Telephone Encounter (Signed)
Nurses please order vitamin D level patient may do on Friday

## 2021-11-07 NOTE — Telephone Encounter (Signed)
Blood work ordered in Epic. Left message to return call 

## 2021-11-07 NOTE — Telephone Encounter (Signed)
Patient notified and verbalized understanding. 

## 2021-11-10 ENCOUNTER — Other Ambulatory Visit: Payer: Self-pay | Admitting: Family Medicine

## 2021-11-12 ENCOUNTER — Other Ambulatory Visit: Payer: Self-pay | Admitting: Family Medicine

## 2021-11-12 LAB — VITAMIN D 25 HYDROXY (VIT D DEFICIENCY, FRACTURES): Vit D, 25-Hydroxy: 63 ng/mL (ref 30.0–100.0)

## 2021-11-12 NOTE — Telephone Encounter (Signed)
Typically after 8 weeks large vitamin D we recommend OTC vitamin D 2000 units daily. May refuse prescription after informing patient of the above thank you

## 2021-11-13 NOTE — Telephone Encounter (Signed)
Patient notified and stated she was aware to switch to the OTC Vit D and the pharmacy sent it on their own

## 2021-12-12 ENCOUNTER — Ambulatory Visit (INDEPENDENT_AMBULATORY_CARE_PROVIDER_SITE_OTHER): Payer: Medicare HMO | Admitting: Pharmacist

## 2021-12-12 DIAGNOSIS — M810 Age-related osteoporosis without current pathological fracture: Secondary | ICD-10-CM

## 2021-12-12 DIAGNOSIS — E782 Mixed hyperlipidemia: Secondary | ICD-10-CM

## 2021-12-12 DIAGNOSIS — Z789 Other specified health status: Secondary | ICD-10-CM

## 2021-12-12 NOTE — Chronic Care Management (AMB) (Signed)
Chronic Care Management Pharmacy Note  12/12/2021 Name:  Rachael Matthews MRN:  [id number] DOB:  [birth year]  Summary: Hyperlipidemia: Uncontrolled. LDL above goal of <70 due to very high risk given cerebral small vessel disease and family history of cardiovascular disease per 2020 AACE/ACE guidelines.  Current medications: alirocumab (Praluent) 75 mg subcutaneously every 2 weeks (takes every other Monday) Patient reports she has taken 3 doses since our last visit and has been tolerating them well.  Continue alirocumab (Praluent) 75 mg subcutaneously every 2 weeks Check fasting lipid panel in next 4 weeks. Order placed today. If LDL remains elevated, can increase dose of Praluent to 150 mg subcutaneously every 2 weeks.   Osteoporosis: Follows with [facility] First dose of Prolia given on 11/13/21. Patient reports her next appointment scheduled for 03/15/22 Continue denosumab (Prolia) 60 mg subcutaneously per [facility] Continue calcium + vitamin D supplementation  Subjective: PRENTISS Matthews is an 75 y.o. year old female who is a primary patient of Luking, Elayne Snare, MD.  The CCM team was consulted for assistance with disease management and care coordination needs.    Engaged with patient by telephone for follow up visit in response to provider referral for pharmacy case management and/or care coordination services.   Consent to Services:  The patient was given information about Chronic Care Management services, agreed to services, and gave verbal consent prior to initiation of services.  Please see initial visit note for detailed documentation.   Patient Care Team: Kathyrn Drown, MD as PCP - General (Family Medicine) Kathyrn Drown, MD (Family Medicine) Gala Romney Cristopher Estimable, MD as Consulting Physician (Gastroenterology) Beryle Lathe, [facility] (Pharmacist)  Objective:  Lab Results  Component Value Date   CREATININE 0.72 09/09/2021    CREATININE 0.73 02/28/2021   CREATININE 0.79 07/25/2020    No results found for: HGBA1C Last diabetic Eye exam: No results found for: HMDIABEYEEXA  Last diabetic Foot exam: No results found for: HMDIABFOOTEX      Component Value Date/Time   CHOL 325 (H) 02/28/2021 0859   TRIG 131 02/28/2021 0859   HDL 46 02/28/2021 0859   CHOLHDL 7.1 (H) 02/28/2021 0859   CHOLHDL 2.6 07/01/2016 0846   VLDL 24 07/01/2016 0846   LDLCALC 255 (H) 02/28/2021 0859    Hepatic Function Latest Ref Rng & Units 07/25/2020 08/06/2018 11/11/2017  Total Protein 6.0 - 8.5 g/dL 7.1 6.6 6.9  Albumin 3.7 - 4.7 g/dL 4.4 4.1 3.9  AST 0 - 40 IU/L $Remov'20 18 20  'vtyyrL$ ALT 0 - 32 IU/L $Remov'10 12 10  'bLJkIJ$ Alk Phosphatase 48 - 121 IU/L 58 46 48  Total Bilirubin 0.0 - 1.2 mg/dL 0.5 0.4 0.4  Bilirubin, Direct 0.00 - 0.40 mg/dL - 0.10 0.09    Lab Results  Component Value Date/Time   TSH 2.620 08/08/2020 10:03 AM   TSH 2.740 09/06/2018 09:04 AM   FREET4 0.97 08/08/2020 10:03 AM   FREET4 1.11 09/06/2018 09:04 AM    CBC Latest Ref Rng & Units 08/08/2020 09/06/2018 03/28/2018  WBC 3.4 - 10.8 x10E3/uL 8.7 8.1 7.5  Hemoglobin 11.1 - 15.9 g/dL 14.6 14.1 14.0  Hematocrit 34.0 - 46.6 % 45.3 41.5 43.3  Platelets 150 - 450 x10E3/uL 307 342 269    Lab Results  Component Value Date/Time   VD25OH 63.0 11/11/2021 09:04 AM   VD25OH 20.2 (L) 09/09/2021 11:30 AM    Clinical ASCVD: No  The ASCVD Risk score (Arnett DK, et al., 2019)  failed to calculate for the following reasons:   The valid total cholesterol range is 130 to 320 mg/dL     Social History   Tobacco Use  Smoking Status Never  Smokeless Tobacco Never   BP Readings from Last 3 Encounters:  10/07/21 (!) 142/84  09/09/21 130/82  03/06/21 126/82   Pulse Readings from Last 3 Encounters:  10/07/21 68  09/09/21 84  08/12/18 76   Wt Readings from Last 3 Encounters:  10/07/21 132 lb 6.4 oz (60.1 kg)  09/09/21 130 lb (59 kg)  03/06/21 127 lb (57.6 kg)    Assessment: Review of  patient past medical history, allergies, medications, health status, including review of consultants reports, laboratory and other test data, was performed as part of comprehensive evaluation and provision of chronic care management services.   SDOH:  (Social Determinants of Health) assessments and interventions performed:    CCM Care Plan  Allergies  Allergen Reactions   Codeine Other (See Comments)    Jittery and unable to sleep   Reclast [Zoledronic Acid]     Joint and muscle pain and fatigue for 1 week after infusion   Statins Other (See Comments)    "Cant remember how to do anything"   Fosamax [Alendronate Sodium] Other (See Comments)    esophagitis    Medications Reviewed Today     Reviewed by Beryle Lathe, [facility] (Pharmacist) on 12/12/21 at 1134  Med List Status: <None>   Medication Order Taking? Sig Documenting Provider Last Dose Status Informant  Alirocumab (PRALUENT) 75 MG/ML SOAJ [id number] Yes Inject 75 mg into the skin every 14 (fourteen) days. Kathyrn Drown, MD Taking Active   calcium citrate (CALCITRATE - DOSED IN MG ELEMENTAL CALCIUM) 950 (200 Ca) MG tablet [id number] Yes Take 200 mg of elemental calcium by mouth 2 (two) times daily. [provider] Taking Active   Cholecalciferol 50 MCG (2000 UT) CAPS [id number] Yes Take 1 capsule by mouth daily. [provider] Taking Active Self  denosumab (PROLIA) 60 MG/ML SOSY injection [id number] Yes Inject 60 mg into the skin every 6 (six) months. [provider] Taking Active            Med Note Beryle Lathe   Fri Dec 12, 2021 11:34 AM) Gets at [facility] - last dose given 11/13/21; next scheduled for 03/15/22  famotidine (PEPCID) 40 MG tablet [id number] Yes TAKE 1 TABLET BY MOUTH DAILY  Patient taking differently: Take 40 mg by mouth every evening.   Kathyrn Drown, MD Taking Active Self  fexofenadine (ALLEGRA) 180 MG tablet [id number] Yes Take 180 mg by mouth in the  morning. [provider] Taking Active Self  Lactase (LACTAID PO) [id number] Yes Take 1 tablet by mouth daily as needed (for lactose).  [provider] Taking Active Self  Magnesium 400 MG CAPS [id number] Yes Take 1 capsule by mouth daily. [provider] Taking Active Self  Multiple Vitamins-Minerals (CENTRUM SILVER 50+WOMEN) TABS [id number] Yes Take 1 tablet by mouth daily. [provider] Taking Active Self  naproxen sodium (ALEVE) 220 MG tablet [id number] Yes Take 220 mg by mouth daily as needed. [provider] Taking Active Self  Polyethyl Glycol-Propyl Glycol 0.4-0.3 % SOLN [id number] Yes Place 1 drop into both eyes 2 (two) times daily as needed. [provider] Taking Active   topiramate (TOPAMAX) 25 MG tablet [id number] Yes TAKE 1 TABLET BY MOUTH TWICE DAILY Luking, Elayne Snare, MD Taking Active  Patient Active Problem List   Diagnosis Date Noted   Meningioma ([place]) 04/21/2021   Referred otalgia of right ear 08/01/2020   Sensorineural hearing loss (SNHL), bilateral 08/01/2020   Temporomandibular jaw dysfunction 08/01/2020   Family history of premature coronary artery disease 04/27/2016   Diverticulosis of colon without hemorrhage    Irritable bowel syndrome with diarrhea 12/10/2015   Migraine 08/12/2015   GERD (gastroesophageal reflux disease) 07/06/2014   Osteoporosis 11/19/2013   Hyperlipemia 07/11/2013   Other nonspecific abnormal result of function study of brain and central nervous system 02/20/2013   Shortness of breath 07/31/2011    Immunization History  Administered Date(s) Administered   Fluad Quad(high Dose 65+) 12/12/2020, 09/09/2021   Influenza Split 11/15/2013   Influenza,inj,Quad PF,6+ Mos 12/03/2014, 10/29/2015, 12/23/2016, 12/01/2017, 11/29/2018   Influenza-Unspecified 09/27/2012   Moderna Sars-Covid-2 Vaccination 03/11/2020, 04/11/2020   Pneumococcal Conjugate-13 12/03/2014   Pneumococcal  Polysaccharide-23 11/15/2013   Zoster, Live 12/02/2012    Conditions to be addressed/monitored: HLD and Osteoporosis  Care Plan : Medication Management  Updates made by Beryle Lathe, [place] since 12/12/2021 12:00 AM     Problem: Hyperlipidemia and Osteoporosis   Priority: High  Onset Date: 09/22/2021     Long-Range Goal: Disease Progression Prevention   Start Date: 09/22/2021  Expected End Date: 12/21/2021  Recent Progress: On track  Priority: High  Note:   Current Barriers:  Unable to achieve control of hyperlipidemia and osteoporosis  Pharmacist Clinical Goal(s):  Over the next 90 days, patient will Achieve control of hyperlipidemia and osteoporosis as evidenced by improved LDL and improved bone mineral density through collaboration with PharmD and provider.   Interventions: 1:1 collaboration with Kathyrn Drown, MD regarding development and update of comprehensive plan of care as evidenced by provider attestation and co-signature Inter-disciplinary care team collaboration (see longitudinal plan of care) Comprehensive medication review performed; medication list updated in electronic medical record  Hyperlipidemia: Uncontrolled. LDL above goal of <70 due to very high risk given cerebral small vessel disease and family history of cardiovascular disease per 2020 AACE/ACE guidelines. Triglycerides at goal of <150 per 2020 AACE/ACE guidelines. Patient has not been formally diagnosed with heterozygous or homozygous familial hypercholesterolemia but suspicion present given family history of cardiovascular disease and extremely elevated LDL levels without large body habitus Patient has a history of taking Repatha and Praluent which she tolerated and were effective. LDL was decreased to 61. However, she has been unable to afford and has not been on these medications in quite some time. LDL now rebounded back to >200.  Patient workup completed for Praluent patient assistance  program; however, the patient does not qualify based on household size and income. She would have to pay the co-pay through her insurance which has been challenging since she reaches the coverage gap relatively quickly in the year and is unable to continue to pay the co-pay. Can consider [employer] foundation for assistance if lipid fund reopens.  Current medications: alirocumab (Praluent) 75 mg subcutaneously every 2 weeks (takes every other Monday) Patient reports she has taken 3 doses since our last visit and has been tolerating them well.  Intolerances:  Statins and ezetimibe (memory impairment) Taking medications as directed: yes Side effects thought to be attributed to current medication regimen: no Continue alirocumab (Praluent) 75 mg subcutaneously every 2 weeks Check fasting lipid panel in next 4 weeks. Order placed today. If LDL remains elevated, can increase dose of Praluent to 150 mg subcutaneously every 2 weeks.  Reviewed risks  of hyperlipidemia, principles of treatment and consequences of untreated hyperlipidemia Statin intolerance noted. No statin due to serious side effects (ex. Myalgias with at least 2 different statins)  Osteoporosis: Appropriately managed  Follows with [facility] Current medications: Approximately 3,000 IU of vitamin D daily + calcium citrate twice daily and denosumab (Prolia) 60 mg subcutaneously every 6 months First dose of Prolia given on 11/13/21. Patient reports her next appointment scheduled for 03/15/22 Intolerances:  alendronate (esophagitis) and zoledronic acid (fatigue and muscle/joint pain 1 week after infusion) Last zoledronic acid (Reclast) infusion in 2019 Taking medications as directed: yes Side effects thought to be attributed to current medication regimen: no Last DEXA (02/06/21): T score = -3.1 R femur neck and -2.8 L femur neck Falls reported in last year: 0 Current exercise: not discussed today Continue denosumab (Prolia) 60 mg  subcutaneously per [facility] Recommend 1,000 mg of elemental calcium per day from diet or supplementation. Supplemental calcium not necessary if appropriate amount obtained through diet. Calcium citrate supplementation preferred due to chronic gastric acid-suppressive therapy Recommend 800 - 2,000 IU of vitamin D supplementation per day to maintain adequate vitamin D levels once course of high dose vitamin D completed to resolve low vitamin D level Recommend walking, low impact aerobic exercise, or strength training  Patient Goals/Self-Care Activities Over the next 90 days, patient will:  Take medications as prescribed Collaborate with provider on medication access solutions  Follow Up Plan: Telephone follow up appointment with care management team member scheduled for: 03/13/22      Medication Assistance: None required.  Patient affirms current coverage meets needs.  Patient's preferred pharmacy is:  [id number] - [place], [place] - [address] AT [place]. [place] [address] Phone: [phone] Fax: [place], [place] [address] [place] [address] Phone: [phone] Fax: [phone]  Follow Up:  Patient agrees to Care Plan and Follow-up.  Plan: Telephone follow up appointment with care management team member scheduled for:  03/13/22  Kennon Holter, PharmD, BCACP, CPP Clinical Pharmacist Practitioner [place] [phone]

## 2021-12-12 NOTE — Patient Instructions (Signed)
Rachael Matthews,  It was great to talk to you today!  Come get your cholesterol checked at your convenience sometime over the next 4 weeks. Please complete first thing in the morning as fasting if possible.   Please call me with any questions or concerns.   Visit Information  Following are the goals we discussed today:  Patient Goals/Self-Care Activities Over the next 90 days, patient will:  Take medications as prescribed Collaborate with provider on medication access solutions  Plan: Telephone follow up appointment with care management team member scheduled for:  03/13/22  Kennon Holter, PharmD, BCACP, CPP Clinical Pharmacist Practitioner [place] [phone]  Please call the care guide team at [phone] if you need to cancel or reschedule your appointment.   Patient verbalizes understanding of instructions provided today and agrees to view in [place].

## 2021-12-27 DIAGNOSIS — M810 Age-related osteoporosis without current pathological fracture: Secondary | ICD-10-CM | POA: Diagnosis not present

## 2021-12-27 DIAGNOSIS — E782 Mixed hyperlipidemia: Secondary | ICD-10-CM

## 2021-12-27 LAB — LIPID PANEL
Chol/HDL Ratio: 3.8 ratio (ref 0.0–4.4)
Cholesterol, Total: 164 mg/dL (ref 100–199)
HDL: 43 mg/dL (ref >39)
LDL Chol Calc (NIH): 86 mg/dL (ref 0–99)
Triglycerides: 207 mg/dL — ABNORMAL HIGH (ref 0–149)
VLDL Cholesterol Cal: 35 mg/dL (ref 5–40)

## 2022-02-02 ENCOUNTER — Encounter: Payer: Self-pay | Admitting: Nurse Practitioner

## 2022-02-02 ENCOUNTER — Other Ambulatory Visit: Payer: Self-pay

## 2022-02-02 ENCOUNTER — Ambulatory Visit (INDEPENDENT_AMBULATORY_CARE_PROVIDER_SITE_OTHER): Payer: Medicare HMO | Admitting: Nurse Practitioner

## 2022-02-02 VITALS — BP 122/84 | HR 87 | Temp 96.3°F | Wt 126.2 lb

## 2022-02-02 DIAGNOSIS — U071 COVID-19: Secondary | ICD-10-CM

## 2022-02-02 MED ORDER — NIRMATRELVIR/RITONAVIR (PAXLOVID)TABLET: 3.0000 | ORAL_TABLET | Freq: Two times a day (BID) | ORAL | 0 refills | Status: AC

## 2022-02-02 NOTE — Progress Notes (Signed)
° °  Subjective:    Patient ID: Rachael Matthews, female    DOB: 12-Jan-1946, 76 y.o.   MRN: [id number]  HPI  Patient presents today with respiratory illness Number of days present - Saturday night  Symptoms include- headache, sore throat, congestion, nausea, chills, night sweats, dry cough, muscle/body aches, fatigue.  Presence of worrisome signs (severe shortness of breath, lethargy, etc.) - none  Recent/current visit to urgent care or ER- none  Recent direct exposure to Covid- husband   Any current Covid testing-  tested positive last night    Review of Systems  Constitutional:  Positive for chills and fatigue.  HENT:  Positive for congestion and sore throat.   Respiratory:  Positive for cough.   Gastrointestinal:  Positive for nausea.  Neurological:  Positive for headaches.  All other systems reviewed and are negative.     Objective:   Physical Exam Constitutional:      General: She is not in acute distress.    Appearance: Normal appearance. She is normal weight. She is not ill-appearing or toxic-appearing.  HENT:     Head: Normocephalic.  Cardiovascular:     Rate and Rhythm: Normal rate and regular rhythm.     Pulses: Normal pulses.     Heart sounds: No murmur heard.   No gallop.  Pulmonary:     Effort: Pulmonary effort is normal. No respiratory distress.     Breath sounds: No wheezing.  Musculoskeletal:        General: Normal range of motion.     Cervical back: Normal range of motion. No rigidity or tenderness.  Skin:    General: Skin is warm.     Capillary Refill: Capillary refill takes less than 2 seconds.  Neurological:     General: No focal deficit present.     Mental Status: She is alert and oriented to person, place, and time.  Psychiatric:        Mood and Affect: Mood normal.        Behavior: Behavior normal.          Assessment & Plan:   1. COVID -nirmatrelvir/ritonavir EUA (PAXLOVID) 20 x 150 MG & 10 x 100MG  TABSnirmatrelvir/ritonavir EUA  (PAXLOVID) 20 x 150 MG & 10 x 100MG  TABS for 5 days. - GFR 87. - Medication list reviewed for possible dug interactions. -Return to clinic or go to ED if you experience any difficulty breathing, shortness of breath, changes to symptoms. -COVID is detected Under current CDC guidelines it is recommended to stay self isolated for at least 5 days.  If feeling well after 5 days may return to normal activities as long as you wears a mask for 5 days.  If you are not feeling well after 5 days you should stay under self-isolation for 10 days.  Warning signs to watch for if you develops chest tightness shortness of breath severe pain change in mental status you should seek further evaluation in the ER.  If further questions or concerns please let us know

## 2022-03-13 ENCOUNTER — Ambulatory Visit (INDEPENDENT_AMBULATORY_CARE_PROVIDER_SITE_OTHER): Payer: Medicare HMO | Admitting: Pharmacist

## 2022-03-13 DIAGNOSIS — M810 Age-related osteoporosis without current pathological fracture: Secondary | ICD-10-CM

## 2022-03-13 DIAGNOSIS — Z789 Other specified health status: Secondary | ICD-10-CM

## 2022-03-13 DIAGNOSIS — E782 Mixed hyperlipidemia: Secondary | ICD-10-CM

## 2022-03-13 NOTE — Chronic Care Management (AMB) (Signed)
? ? ?Chronic Care Management ?Pharmacy Note ? ?03/13/2022 ?Name:  Rachael Matthews MRN:  [id number] DOB:  10-30-1946 ? ?Summary: ?Hyperlipidemia: ?Uncontrolled but significantly improved. Last LDL was 85 which is down from 255 but LDL remains above goal of <70 due to very high risk given cerebral small vessel disease and family history of cardiovascular disease per 2020 AACE/ACE guidelines. ?Patient has not been formally diagnosed with heterozygous or homozygous familial hypercholesterolemia but suspicion present given family history of cardiovascular disease and extremely elevated LDL levels without large body habitus ?Current medications: alirocumab (Praluent) 75 mg subcutaneously every 2 weeks (takes every other Monday) ?Intolerances: Statins and ezetimibe (memory impairment) ?Continue alirocumab (Praluent) 75 mg subcutaneously every 2 weeks ?Continue to monitor fasting lipid panel every 6-12 months ? ?Osteoporosis: ?Appropriately managed. Follows with [facility] ?Current medications: Approximately 3,000 IU of vitamin D daily + calcium citrate twice daily and denosumab (Prolia) 60 mg subcutaneously every 6 months ?First dose of Prolia given on 11/13/21. Patient reports her next appointment scheduled for May 2023.  ?Last DEXA (02/06/21): T score = -3.1 R femur neck and -2.8 L femur neck ?Continue denosumab (Prolia) 60 mg subcutaneously per [facility] ?Recommend 1,000 mg of elemental calcium per day from diet or supplementation. Supplemental calcium not necessary if appropriate amount obtained through diet. ?Calcium citrate supplementation preferred due to chronic gastric acid-suppressive therapy ?Recommend 800 - 2,000 IU of vitamin D supplementation per day to maintain adequate vitamin D levels once course of high dose vitamin D completed to resolve low vitamin D level ?Recommend walking, low impact aerobic exercise, or strength training ? ?Subjective: ?Rachael Matthews is an 76 y.o.  year old female who is a primary patient of Luking, Elayne Snare, MD.  The CCM team was consulted for assistance with disease management and care coordination needs.   ? ?Engaged with patient by telephone for follow up visit in response to provider referral for pharmacy case management and/or care coordination services.  ? ?Consent to Services:  ?The patient was given information about Chronic Care Management services, agreed to services, and gave verbal consent prior to initiation of services.  Please see initial visit note for detailed documentation.  ? ?Patient Care Team: ?Kathyrn Drown, MD as PCP - General (Family Medicine) ?Kathyrn Drown, MD (Family Medicine) ?Rourk, Cristopher Estimable, MD as Consulting Physician (Gastroenterology) ?Beryle Lathe, [facility] (Pharmacist) ? ?Objective: ? ?Lab Results  ?Component Value Date  ? CREATININE 0.72 09/09/2021  ? CREATININE 0.73 02/28/2021  ? CREATININE 0.79 07/25/2020  ? ? ?No results found for: HGBA1C ?Last diabetic Eye exam: No results found for: HMDIABEYEEXA  ?Last diabetic Foot exam: No results found for: HMDIABFOOTEX  ? ?   ?Component Value Date/Time  ? CHOL 164 12/26/2021 0905  ? TRIG 207 (H) 12/26/2021 0905  ? HDL 43 12/26/2021 0905  ? CHOLHDL 3.8 12/26/2021 0905  ? CHOLHDL 2.6 07/01/2016 0846  ? VLDL 24 07/01/2016 0846  ? [place] 86 12/26/2021 0905  ? ? ?Hepatic Function Latest Ref Rng & Units 07/25/2020 08/06/2018 11/11/2017  ?Total Protein 6.0 - 8.5 g/dL 7.1 6.6 6.9  ?Albumin 3.7 - 4.7 g/dL 4.4 4.1 3.9  ?AST 0 - 40 IU/L _0 ?ALT 0 - 32 IU/L _1 ?Alk Phosphatase 48 - 121 IU/L 58 46 48  ?Total Bilirubin 0.0 - 1.2 mg/dL 0.5 0.4 0.4  ?Bilirubin, Direct 0.00 - 0.40 mg/dL - 0.10 0.09  ? ? ?Lab Results  ?Component  Value Date/Time  ? TSH 2.620 08/08/2020 10:03 AM  ? TSH 2.740 09/06/2018 09:04 AM  ? FREET4 0.97 08/08/2020 10:03 AM  ? FREET4 1.11 09/06/2018 09:04 AM  ? ? ?CBC Latest Ref Rng & Units 08/08/2020 09/06/2018 03/28/2018  ?WBC 3.4 - 10.8 x10E3/uL 8.7 8.1 7.5   ?Hemoglobin 11.1 - 15.9 g/dL 14.6 14.1 14.0  ?Hematocrit 34.0 - 46.6 % 45.3 41.5 43.3  ?Platelets 150 - 450 x10E3/uL 307 342 269  ? ? ?Lab Results  ?Component Value Date/Time  ? VD25OH 63.0 11/11/2021 09:04 AM  ? VD25OH 20.2 (L) 09/09/2021 11:30 AM  ? ? ?Clinical ASCVD: No  ?The 10-year ASCVD risk score (Arnett DK, et al., 2019) is: 16% ?  Values used to calculate the score: ?    Age: 24 years ?    Sex: Female ?    Is Non-Hispanic African American: No ?    Diabetic: No ?    Tobacco smoker: No ?    Systolic Blood Pressure: 102 mmHg ?    Is BP treated: No ?    HDL Cholesterol: 43 mg/dL ?    Total Cholesterol: 164 mg/dL   ? ?Social History  ? ?Tobacco Use  ?Smoking Status Never  ?Smokeless Tobacco Never  ? ?BP Readings from Last 3 Encounters:  ?02/02/22 122/84  ?10/07/21 (!) 142/84  ?09/09/21 130/82  ? ?Pulse Readings from Last 3 Encounters:  ?02/02/22 87  ?10/07/21 68  ?09/09/21 84  ? ?Wt Readings from Last 3 Encounters:  ?02/02/22 126 lb 3.2 oz (57.2 kg)  ?10/07/21 132 lb 6.4 oz (60.1 kg)  ?09/09/21 130 lb (59 kg)  ? ? ?Assessment: Review of patient past medical history, allergies, medications, health status, including review of consultants reports, laboratory and other test data, was performed as part of comprehensive evaluation and provision of chronic care management services.  ? ?SDOH:  (Social Determinants of Health) assessments and interventions performed:  ? ? ?CCM Care Plan ? ?Allergies  ?Allergen Reactions  ? Codeine Other (See Comments)  ?  Jittery and unable to sleep  ? Reclast [Zoledronic Acid]   ?  Joint and muscle pain and fatigue for 1 week after infusion  ? Statins Other (See Comments)  ?  "Cant remember how to do anything"  ? Fosamax [Alendronate Sodium] Other (See Comments)  ?  esophagitis  ? ? ?Medications Reviewed Today   ? ? Reviewed by Beryle Lathe, [facility] (Pharmacist) on 03/13/22 at 1051  Med List Status: <None>  ? ?Medication Order Taking? Sig Documenting Provider Last Dose Status  Informant  ?Alirocumab (PRALUENT) 75 MG/ML SOAJ [id number] Yes Inject 75 mg into the skin every 14 (fourteen) days. Kathyrn Drown, MD Taking Active   ?calcium citrate (CALCITRATE - DOSED IN MG ELEMENTAL CALCIUM) 950 (200 Ca) MG tablet [id number] Yes Take 200 mg of elemental calcium by mouth 2 (two) times daily. [provider] Taking Active   ?Carboxymethylcellul-Glycerin (REFRESH OPTIVE) 1-0.9 % GEL [id number] Yes Place 1 drop into both eyes 2 (two) times daily. [provider] Taking Active Self  ?Cholecalciferol 50 MCG (2000 UT) CAPS [id number] Yes Take 1 capsule by mouth daily. [provider] Taking Active Self  ?denosumab (PROLIA) 60 MG/ML SOSY injection [id number] Yes Inject 60 mg into the skin every 6 (six) months. [provider] Taking Active   ?         ?Med Note Beryle Lathe   Fri Mar 13, 2022 10:39 AM) Marguerite Olea at [facility] -  next dose due May 2023  ?famotidine (PEPCID) 40 MG tablet [id number] Yes TAKE 1 TABLET BY MOUTH DAILY  ?Patient taking differently: Take 40 mg by mouth every evening.  ? Kathyrn Drown, MD Taking Active Self  ?fexofenadine (ALLEGRA) 180 MG tablet [id number] Yes Take 180 mg by mouth in the morning. [provider] Taking Active Self  ?Lactase (LACTAID PO) [id number] Yes Take 1 tablet by mouth daily as needed (for lactose).  [provider] Taking Active Self  ?Magnesium 400 MG CAPS [id number] Yes Take 1 capsule by mouth daily. [provider] Taking Active Self  ?Multiple Vitamins-Minerals (CENTRUM SILVER 50+WOMEN) TABS [id number] Yes Take 1 tablet by mouth daily. [provider] Taking Active Self  ?naproxen sodium (ALEVE) 220 MG tablet [id number] Yes Take 220 mg by mouth daily as needed. [provider] Taking Active Self  ?topiramate (TOPAMAX) 25 MG tablet [id number] Yes TAKE 1 TABLET BY MOUTH TWICE DAILY  ?Patient taking differently: TAKE 1 TABLET BY MOUTH 1-2 TIMES PER DAY AS  NEEDED  ? Kathyrn Drown, MD Taking Active Self  ? ?  ?  ? ?  ? ? ?Patient Active Problem List  ? Diagnosis Date Noted  ? Meningioma ([place]) 04/21/2021  ? Referred otalgia of right ear 08/01/2020  ? Sensorineural

## 2022-03-13 NOTE — Patient Instructions (Signed)
Cherylann Ratel, ? ?It was great to talk to you today! ? ?Please call me with any questions or concerns. ? ?Visit Information ? ?Following are the goals we discussed today:  ? Goals Addressed   ? ?  ?  ?  ?  ? This Visit's Progress  ?  Medication Management     ?  Patient Goals/Self-Care Activities ?Over the next 90 days, patient will:  ?Take medications as prescribed ?Collaborate with provider on medication access solutions ? ? ? ?  ? ?  ?  ? ?Follow-up plan: Next PCP appointment scheduled for: 03/17/22 ? ?Patient verbalizes understanding of instructions and care plan provided today and agrees to view in [place]. Active MyChart status confirmed with patient.   ? ?Please call the care guide team at [phone] if you need to cancel or reschedule your appointment.  ? ?Kennon Holter, PharmD, BCACP, CPP ?Clinical Pharmacist Practitioner ?[place] ?[phone]  ?

## 2022-03-17 ENCOUNTER — Ambulatory Visit (INDEPENDENT_AMBULATORY_CARE_PROVIDER_SITE_OTHER): Payer: Medicare HMO | Admitting: Family Medicine

## 2022-03-17 ENCOUNTER — Other Ambulatory Visit: Payer: Self-pay

## 2022-03-17 ENCOUNTER — Encounter: Payer: Self-pay | Admitting: Family Medicine

## 2022-03-17 VITALS — BP 134/78 | HR 70 | Temp 97.2°F | Ht 59.5 in | Wt 125.0 lb

## 2022-03-17 DIAGNOSIS — D329 Benign neoplasm of meninges, unspecified: Secondary | ICD-10-CM

## 2022-03-17 DIAGNOSIS — G43009 Migraine without aura, not intractable, without status migrainosus: Secondary | ICD-10-CM

## 2022-03-17 DIAGNOSIS — E782 Mixed hyperlipidemia: Secondary | ICD-10-CM

## 2022-03-17 MED ORDER — AZELASTINE HCL 0.1 % NA SOLN: 2.0000 | Freq: Two times a day (BID) | NASAL | 12 refills | Status: AC

## 2022-03-17 NOTE — Progress Notes (Signed)
? ?  Subjective:  ? ? Patient ID: Rachael Matthews, female    DOB: Aug 23, 1946, 76 y.o.   MRN: [id number] ? ?Migraine  ?This is a chronic problem. Treatments tried: topiramate.  ? ?Osteoporosis - refill praluent  ?No diagnosis found. ?Migraine without aura and without status migrainosus, not intractable ? ?Mixed hyperlipidemia ? ?Meningioma ([place]) ?Patient still gets migraines intermittently.  She is interested in trying a different medication to see if it might help her with her headaches ?She denies any vomiting with her headaches. ?Does have meningioma and does need follow-up every 2 years based on what neurosurgeon stated to myself upon previous scan ?Review of Systems ? ?   ?Objective:  ? Physical Exam ? ?General-in no acute distress ?Eyes-no discharge ?Lungs-respiratory rate normal, CTA ?CV-no murmurs,RRR ?Extremities skin warm dry no edema ?Neuro grossly normal ?Behavior normal, alert ? ? ? ?   ?Assessment & Plan:  ?1. Migraine without aura and without status migrainosus, not intractable ?We will try new medication continue topiramate ?Currently looking at Nurtec 75 mg 1 pill taken at first sign of a migraine no more than 1 pill/day not for frequent use we will send patient a MyChart message regarding this medicine and she will make up her mind it appears that this medicine is covered by her insurance ?Cerumen triptans are not indicated for this patient because of her age and severe lipid issues ? ? ?2. Mixed hyperlipidemia ?Significant lipid issues dramatically better with Praluent continue medication. ? ?3. Meningioma ([place]) ?Repeat scan CT 2 years from the last MRI so that would be 1 year from now.  Patient aware.  This was placed in reminder file as well ? ?Allergy related issues Astelin spray Claritin or Zyrtec ? ?Follow-up 6 months ? ? ?

## 2022-03-19 ENCOUNTER — Other Ambulatory Visit: Payer: Self-pay | Admitting: Family Medicine

## 2022-03-19 ENCOUNTER — Other Ambulatory Visit: Payer: Self-pay | Admitting: *Deleted

## 2022-03-19 MED ORDER — NURTEC 75 MG PO TBDP: ORAL_TABLET | ORAL | 2 refills | Status: DC

## 2022-03-19 NOTE — Addendum Note (Signed)
Addended by: Dairl Ponder on: 03/19/2022 09:43 AM ? ? Modules accepted: Orders ? ?

## 2022-03-27 DIAGNOSIS — M810 Age-related osteoporosis without current pathological fracture: Secondary | ICD-10-CM | POA: Diagnosis not present

## 2022-03-27 DIAGNOSIS — E782 Mixed hyperlipidemia: Secondary | ICD-10-CM | POA: Diagnosis not present

## 2022-05-08 ENCOUNTER — Other Ambulatory Visit: Payer: Self-pay | Admitting: Family Medicine

## 2022-08-04 ENCOUNTER — Other Ambulatory Visit: Payer: Self-pay | Admitting: Family Medicine

## 2022-09-17 ENCOUNTER — Ambulatory Visit: Payer: Self-pay | Admitting: Family Medicine

## 2022-09-21 ENCOUNTER — Encounter: Payer: Self-pay | Admitting: Family Medicine

## 2022-09-21 ENCOUNTER — Ambulatory Visit (INDEPENDENT_AMBULATORY_CARE_PROVIDER_SITE_OTHER): Payer: Medicare HMO | Admitting: Family Medicine

## 2022-09-21 VITALS — BP 135/75 | HR 65 | Wt 125.8 lb

## 2022-09-21 DIAGNOSIS — E782 Mixed hyperlipidemia: Secondary | ICD-10-CM

## 2022-09-21 DIAGNOSIS — Z789 Other specified health status: Secondary | ICD-10-CM

## 2022-09-21 DIAGNOSIS — Z79899 Other long term (current) drug therapy: Secondary | ICD-10-CM

## 2022-09-21 DIAGNOSIS — G43009 Migraine without aura, not intractable, without status migrainosus: Secondary | ICD-10-CM

## 2022-09-21 DIAGNOSIS — K219 Gastro-esophageal reflux disease without esophagitis: Secondary | ICD-10-CM | POA: Diagnosis not present

## 2022-09-21 DIAGNOSIS — Z23 Encounter for immunization: Secondary | ICD-10-CM | POA: Diagnosis not present

## 2022-09-21 DIAGNOSIS — Z8249 Family history of ischemic heart disease and other diseases of the circulatory system: Secondary | ICD-10-CM

## 2022-09-21 MED ORDER — TOPIRAMATE 25 MG PO TABS: 25.0000 mg | ORAL_TABLET | Freq: Two times a day (BID) | ORAL | 6 refills | Status: DC

## 2022-09-21 MED ORDER — FAMOTIDINE 40 MG PO TABS: 40.0000 mg | ORAL_TABLET | Freq: Every day | ORAL | 1 refills | Status: DC

## 2022-09-21 MED ORDER — NURTEC 75 MG PO TBDP: ORAL_TABLET | ORAL | 2 refills | Status: DC

## 2022-09-21 MED ORDER — PRALUENT 75 MG/ML ~~LOC~~ SOAJ: 75.0000 mg | SUBCUTANEOUS | 11 refills | Status: DC

## 2022-09-21 NOTE — Progress Notes (Signed)
   Subjective:    Patient ID: Rachael Matthews, female    DOB: 1946-09-08, 76 y.o.   MRN: [id number]  HPI Pt arrives for follow up. Pt states migraines are the same, no worse no improvement. Pt did not cover Nurtec. Pt is taking Topamax every night.   Gastroesophageal reflux disease without esophagitis  Mixed hyperlipidemia - Plan: Alirocumab (PRALUENT) 75 MG/ML SOAJ, Lipid panel, Hepatic function panel, Basic metabolic panel  Family history of premature coronary artery disease - Plan: Alirocumab (PRALUENT) 75 MG/ML SOAJ  Statin intolerance - Plan: Alirocumab (PRALUENT) 75 MG/ML SOAJ  Migraine without aura and without status migrainosus, not intractable  Need for vaccination - Plan: Flu Vaccine QUAD High Dose(Fluad)  High risk medication use - Plan: Lipid panel, Hepatic function panel, Basic metabolic panel   Review of Systems     Objective:   Physical Exam General-in no acute distress Eyes-no discharge Lungs-respiratory rate normal, CTA CV-no murmurs,RRR Extremities skin warm dry no edema Neuro grossly normal Behavior normal, alert        Assessment & Plan:  1. Mixed hyperlipidemia Check cholesterol profile.  Continue Praluent.  It is benefiting her.  She does not tolerate statins. - Alirocumab (PRALUENT) 75 MG/ML SOAJ; Inject 75 mg into the skin every 14 (fourteen) days.  Dispense: 2 mL; Refill: 11 - Lipid panel - Hepatic function panel - Basic metabolic panel  2. Family history of premature coronary artery disease Family history.  Very important to keep cholesterol profile down. - Alirocumab (PRALUENT) 75 MG/ML SOAJ; Inject 75 mg into the skin every 14 (fourteen) days.  Dispense: 2 mL; Refill: 11  3. Statin intolerance Does not tolerate statins with myalgias joint pains - Alirocumab (PRALUENT) 75 MG/ML SOAJ; Inject 75 mg into the skin every 14 (fourteen) days.  Dispense: 2 mL; Refill: 11  4. Gastroesophageal reflux disease without esophagitis Reflux related  issues decent control with famotidine.  Healthy diet regular activity recommended avoiding tomato based products caffeine and chocolates  5. Migraine without aura and without status migrainosus, not intractable She has several per month.  She can only tolerate a low-dose of Topamax.  She is not a candidate for indomethacin because of her reflux related issues.  Not a candidate for Imitrex or similar medicines because of cerebrovascular history  6. Need for vaccination Flu vaccine recommended - Flu Vaccine QUAD High Dose(Fluad)  7. High risk medication use Check labs - Lipid panel - Hepatic function panel - Basic metabolic panel  Follow up within 6 months

## 2022-09-24 LAB — BASIC METABOLIC PANEL
BUN/Creatinine Ratio: 25 (ref 12–28)
BUN: 22 mg/dL (ref 8–27)
CO2: 22 mmol/L (ref 20–29)
Calcium: 9.7 mg/dL (ref 8.7–10.3)
Chloride: 104 mmol/L (ref 96–106)
Creatinine, Ser: 0.89 mg/dL (ref 0.57–1.00)
Glucose: 84 mg/dL (ref 70–99)
Potassium: 4.3 mmol/L (ref 3.5–5.2)
Sodium: 143 mmol/L (ref 134–144)
eGFR: 67 mL/min/{1.73_m2} (ref >59)

## 2022-09-24 LAB — LIPID PANEL
Chol/HDL Ratio: 4 ratio (ref 0.0–4.4)
Cholesterol, Total: 174 mg/dL (ref 100–199)
HDL: 44 mg/dL (ref >39)
LDL Chol Calc (NIH): 100 mg/dL — ABNORMAL HIGH (ref 0–99)
Triglycerides: 173 mg/dL — ABNORMAL HIGH (ref 0–149)
VLDL Cholesterol Cal: 30 mg/dL (ref 5–40)

## 2022-09-24 LAB — HEPATIC FUNCTION PANEL
ALT: 13 IU/L (ref 0–32)
AST: 17 IU/L (ref 0–40)
Albumin: 4.2 g/dL (ref 3.8–4.8)
Alkaline Phosphatase: 48 IU/L (ref 44–121)
Bilirubin Total: 0.4 mg/dL (ref 0.0–1.2)
Bilirubin, Direct: 0.11 mg/dL (ref 0.00–0.40)
Total Protein: 6.7 g/dL (ref 6.0–8.5)

## 2022-09-30 ENCOUNTER — Telehealth: Payer: Self-pay | Admitting: Family Medicine

## 2022-09-30 NOTE — Telephone Encounter (Signed)
Patient declined the Medicare Wellness Visit with [facility]  She doesn't fell she needs to complete

## 2022-10-18 ENCOUNTER — Other Ambulatory Visit: Payer: Self-pay | Admitting: Family Medicine

## 2022-10-18 DIAGNOSIS — E782 Mixed hyperlipidemia: Secondary | ICD-10-CM

## 2022-10-18 DIAGNOSIS — Z8249 Family history of ischemic heart disease and other diseases of the circulatory system: Secondary | ICD-10-CM

## 2022-10-18 DIAGNOSIS — Z789 Other specified health status: Secondary | ICD-10-CM

## 2022-10-19 ENCOUNTER — Other Ambulatory Visit: Payer: Self-pay | Admitting: Family Medicine

## 2022-10-19 DIAGNOSIS — Z789 Other specified health status: Secondary | ICD-10-CM

## 2022-10-19 DIAGNOSIS — Z8249 Family history of ischemic heart disease and other diseases of the circulatory system: Secondary | ICD-10-CM

## 2022-10-19 DIAGNOSIS — E782 Mixed hyperlipidemia: Secondary | ICD-10-CM

## 2023-01-29 ENCOUNTER — Telehealth: Payer: Self-pay | Admitting: Family Medicine

## 2023-01-29 ENCOUNTER — Encounter: Payer: Self-pay | Admitting: Family Medicine

## 2023-01-29 DIAGNOSIS — Z789 Other specified health status: Secondary | ICD-10-CM | POA: Insufficient documentation

## 2023-01-29 MED ORDER — REPATHA 140 MG/ML ~~LOC~~ SOSY: PREFILLED_SYRINGE | SUBCUTANEOUS | 6 refills | Status: DC

## 2023-01-29 NOTE — Telephone Encounter (Signed)
Medication sent to pharmacy. Left message to return call 

## 2023-01-29 NOTE — Telephone Encounter (Signed)
Pt returned call. Pt states that she spoke with her insurance and they stated that if we did a PA they would cover it since you received it all last year. Attempted to complete PA through Cover My Meds but CMM states that a PA already exist. Will call insurance to inquire.

## 2023-01-29 NOTE — Telephone Encounter (Signed)
Nurses According to [facility] Apparently Praluent is no longer covered But Reptha is I would recommend sending him Repatha 140 mg subcutaneous every 2 weeks May send in 1 month with 6 refills May also fax the request form to them thank you

## 2023-02-04 NOTE — Telephone Encounter (Signed)
Patient notified and stated she picked up the medication yesterday

## 2023-02-04 NOTE — Telephone Encounter (Signed)
Repatha has been approved.  Praluent denied-attempted PA twice.  Left message to return call

## 2023-02-24 LAB — HM MAMMOGRAPHY

## 2023-02-24 LAB — HM DEXA SCAN

## 2023-03-22 ENCOUNTER — Ambulatory Visit (INDEPENDENT_AMBULATORY_CARE_PROVIDER_SITE_OTHER): Payer: Medicare HMO | Admitting: Family Medicine

## 2023-03-22 VITALS — BP 108/68 | HR 63 | Ht 59.5 in | Wt 129.2 lb

## 2023-03-22 DIAGNOSIS — M810 Age-related osteoporosis without current pathological fracture: Secondary | ICD-10-CM

## 2023-03-22 DIAGNOSIS — D329 Benign neoplasm of meninges, unspecified: Secondary | ICD-10-CM

## 2023-03-22 DIAGNOSIS — Z789 Other specified health status: Secondary | ICD-10-CM

## 2023-03-22 DIAGNOSIS — M545 Low back pain, unspecified: Secondary | ICD-10-CM

## 2023-03-22 MED ORDER — TOPIRAMATE 25 MG PO TABS: 25.0000 mg | ORAL_TABLET | Freq: Two times a day (BID) | ORAL | 6 refills | Status: DC

## 2023-03-22 MED ORDER — FAMOTIDINE 40 MG PO TABS: 40.0000 mg | ORAL_TABLET | Freq: Every day | ORAL | 1 refills | Status: DC

## 2023-03-22 NOTE — Progress Notes (Signed)
   Subjective:    Patient ID: Rachael Matthews, female    DOB: January 18, 1946, 77 y.o.   MRN: [id number]  Back Pain This is a chronic problem. The current episode started more than 1 month ago. The problem occurs daily. The problem has been gradually worsening since onset. The pain is present in the lumbar spine. The quality of the pain is described as aching. The pain radiates to the left thigh. The pain is moderate. The pain is Worse during the day. The symptoms are aggravated by bending, position, sitting, twisting and standing. Stiffness is present All day.   Patient arrives today for 6 month follow up. Patient states she has pain in lower back has been worse for 6 months.  Meningioma ([place]) - Plan: CT HEAD WO CONTRAST (5MM)  Statin intolerance  Age-related osteoporosis without current pathological fracture  Lumbar pain - Plan: DG Lumbar Spine Complete  She does have statin intolerance.  She is on her injectable medication. She also has meningioma that is due for a follow-up on the scan   Review of Systems  Musculoskeletal:  Positive for back pain.       Objective:   Physical Exam General-in no acute distress Eyes-no discharge Lungs-respiratory rate normal, CTA CV-no murmurs,RRR Extremities skin warm dry no edema Neuro grossly normal Behavior normal, alert        Assessment & Plan:  1. Meningioma ([facility]) CT of the head to see where the meningioma is doing in regards to size she denies any significant headaches currently - CT HEAD WO CONTRAST (5MM)  2. Statin intolerance Statin intolerance is on Repatha doing very well recent labs look good  3. Age-related osteoporosis without current pathological fracture We have requested the results of her bone density she is on Prolia shots every 6 months with Dr.Ballen  4. Lumbar pain Ongoing lumbar pain Stretching exercises recommended Tylenol as needed Lumbar x-rays She has an upcoming appointment with chiropractic to do some  fitness training to help her core strength which I think will help Not having any weakness or sciatica currently If that gets worse to follow-up - DG Lumbar Spine Complete

## 2023-03-23 NOTE — Addendum Note (Signed)
Addended by: Orvan Seen on: 03/23/2023 04:40 PM   Modules accepted: Orders

## 2023-03-26 ENCOUNTER — Encounter: Payer: Self-pay | Admitting: *Deleted

## 2023-04-14 ENCOUNTER — Ambulatory Visit: Payer: Medicare HMO | Admitting: Family Medicine

## 2023-04-17 ENCOUNTER — Ambulatory Visit (HOSPITAL_BASED_OUTPATIENT_CLINIC_OR_DEPARTMENT_OTHER)
Admission: RE | Admit: 2023-04-17 | Discharge: 2023-04-17 | Disposition: A | Discharge status: Home / Self Care | Payer: Medicare HMO | Source: Ambulatory Visit | Attending: Family Medicine | Admitting: Family Medicine

## 2023-04-17 DIAGNOSIS — D329 Benign neoplasm of meninges, unspecified: Secondary | ICD-10-CM | POA: Diagnosis present

## 2023-04-19 ENCOUNTER — Telehealth: Payer: Self-pay | Admitting: Family Medicine

## 2023-04-19 NOTE — Telephone Encounter (Signed)
Nurses-scan shows meningioma stable.  Please put into the reminder file to have repeat CT scan of the head in April 2026 due to meningioma thank you  I sent the patient a MyChart message regarding this scan

## 2023-04-20 NOTE — Telephone Encounter (Signed)
Repeat CT added to reminder file

## 2023-04-22 ENCOUNTER — Encounter: Payer: Self-pay | Admitting: *Deleted

## 2023-05-21 ENCOUNTER — Telehealth: Payer: Self-pay | Admitting: Family Medicine

## 2023-05-21 MED ORDER — FAMOTIDINE 40 MG PO TABS: 40.0000 mg | ORAL_TABLET | Freq: Every day | ORAL | 0 refills | Status: DC

## 2023-05-21 NOTE — Telephone Encounter (Signed)
Refill on famotidine 40 mg sent to [facility]

## 2023-05-21 NOTE — Telephone Encounter (Signed)
Received via fax Rx request: Prescription sent electronically to pharmacy  

## 2023-06-10 ENCOUNTER — Ambulatory Visit
Admission: EM | Admit: 2023-06-10 | Discharge: 2023-06-10 | Disposition: A | Discharge status: Home / Self Care | Payer: Medicare HMO | Attending: Nurse Practitioner | Admitting: Nurse Practitioner

## 2023-06-10 DIAGNOSIS — R399 Unspecified symptoms and signs involving the genitourinary system: Secondary | ICD-10-CM | POA: Insufficient documentation

## 2023-06-10 LAB — POCT URINALYSIS DIP (MANUAL ENTRY)
Bilirubin, UA: NEGATIVE
Glucose, UA: NEGATIVE mg/dL
Ketones, POC UA: NEGATIVE mg/dL
Nitrite, UA: NEGATIVE
Protein Ur, POC: NEGATIVE mg/dL
Spec Grav, UA: 1.01 (ref 1.010–1.025)
Urobilinogen, UA: 0.2 E.U./dL
pH, UA: 6.5 (ref 5.0–8.0)

## 2023-06-10 MED ORDER — CEPHALEXIN 500 MG PO CAPS: 500.0000 mg | ORAL_CAPSULE | Freq: Two times a day (BID) | ORAL | 0 refills | Status: AC

## 2023-06-10 NOTE — Discharge Instructions (Signed)
The urinalysis does not indicate an obvious urinary tract infection.  A urine culture has been ordered.  If the results of the urine culture are negative, you will be advised to stop the antibiotic prescribed today.  If the results of the culture indicate that a different antibiotic is recommended, you will be contacted and provided a new prescription. Take medication as prescribed. Increase fluids.  Try to drink at least 8-10 8 ounce glasses of water daily. May take over-the-counter Tylenol as needed for pain, fever, or general discomfort. Develop a toileting schedule that will allow you to urinate every 2 hours. Avoid caffeine such as tea, soda, and coffee while symptoms persist. If you continue to experience symptoms and your urine culture is negative, please follow-up with your primary care physician for further evaluation. If you develop worsening symptoms while taking the antibiotics such as fever, chills, flank pain, worsening low back pain, or other concerns, please go to the emergency department immediately for further evaluation. Follow-up as needed.

## 2023-06-10 NOTE — ED Provider Notes (Signed)
[facility]    CSN: [id number] Arrival date & time: 06/10/23  0840      History   Chief Complaint No chief complaint on file.   HPI Rachael Matthews is a 77 y.o. female.   The history is provided by the patient.   The patient presents for complaints of lower abdominal pressure, urinary frequency, hesitancy, and dysuria that has been present for the past 4 days.  Patient also complains of intermittent nausea.  She denies fever, chills, hematuria, flank pain, decreased urine stream, abdominal pain, or vaginal symptoms.  Patient denies prior history of frequent urinary tract infections or kidney stones.  She reports she has not taken any medication for her symptoms.  Past Medical History:  Diagnosis Date   Depression    Diverticulosis    Family history of premature coronary artery disease 04/27/2016   GERD (gastroesophageal reflux disease)    Headache(784.0)    Meningioma (HCC) 04/21/2021   Small meningioma noted on MRI.  This MRI was in April 2022.  Stable from 2013.   Mixed hyperlipidemia    MRI of brain abnormal    Osteoporosis    Sleep apnea    Small vessel disease (HCC)     Patient Active Problem List   Diagnosis Date Noted   Statin intolerance 01/29/2023   Meningioma (HCC) 04/21/2021   Referred otalgia of right ear 08/01/2020   Sensorineural hearing loss (SNHL), bilateral 08/01/2020   Temporomandibular jaw dysfunction 08/01/2020   Diverticulosis of colon without hemorrhage    Irritable bowel syndrome with diarrhea 12/10/2015   Migraine 08/12/2015   GERD (gastroesophageal reflux disease) 07/06/2014   Osteoporosis 11/19/2013   Hyperlipemia 07/11/2013   Other nonspecific abnormal result of function study of brain and central nervous system 02/20/2013   Shortness of breath 07/31/2011    Past Surgical History:  Procedure Laterality Date   ABDOMINAL HYSTERECTOMY     BLADDER SURGERY     tact   CERVICAL SPINE SURGERY     removal of tumor upper neck.    COLONOSCOPY  06/09/2007   VQQ:VZDG papillae otherwise normal   COLONOSCOPY N/A 12/18/2015   Dr.Rourk- colonic diverticulosis, melanosis coli, bx= benign, stool sample= negative   ESOPHAGOGASTRODUODENOSCOPY (EGD) WITH PROPOFOL N/A 04/04/2018   Procedure: ESOPHAGOGASTRODUODENOSCOPY (EGD) WITH PROPOFOL;  Surgeon: Corbin Ade, MD;  Location: [facility];  Service: Endoscopy;  Laterality: N/A;  8:30am   EYE SURGERY Bilateral 2016   Cataract Surgery Bilateral   MALONEY DILATION N/A 04/04/2018   Procedure: MALONEY DILATION;  Surgeon: Corbin Ade, MD;  Location: [facility];  Service: Endoscopy;  Laterality: N/A;   MANDIBLE FRACTURE SURGERY     jaw line reduced    OB History   No obstetric history on file.      Home Medications    Prior to Admission medications   Medication Sig Start Date End Date Taking? Authorizing Provider  cephALEXin (KEFLEX) 500 MG capsule Take 1 capsule (500 mg total) by mouth 2 (two) times daily for 7 days. 06/10/23 06/17/23 Yes Remo Kirschenmann-Warren, Sadie Haber, NP  azelastine (ASTELIN) 0.1 % nasal spray Place 2 sprays into both nostrils 2 (two) times daily. 03/17/22   Babs Sciara, MD  calcium carbonate (OSCAL) 1500 (600 Ca) MG TABS tablet Take 600 mg by mouth 2 (two) times daily.    [provider]  Carboxymethylcellul-Glycerin (REFRESH OPTIVE) 1-0.9 % GEL Place 1 drop into both eyes 2 (two) times daily.    [provider]  Cholecalciferol 50 MCG (2000 UT) CAPS Take 1 capsule by mouth daily.    [provider]  denosumab (PROLIA) 60 MG/ML SOSY injection Inject 60 mg into the skin every 6 (six) months.    [provider]  Evolocumab (REPATHA) 140 MG/ML SOSY Inject 140 mg subcutaneously q 2 weeks. 01/29/23   Babs Sciara, MD  famotidine (PEPCID) 40 MG tablet Take 1 tablet (40 mg total) by mouth daily. 05/21/23   Babs Sciara, MD  fexofenadine (ALLEGRA) 180 MG tablet Take 180 mg by mouth in the morning.    [provider]   Lactase (LACTAID PO) Take 1 tablet by mouth daily as needed (for lactose).     [provider]  Magnesium 400 MG CAPS Take 1 capsule by mouth daily.    [provider]  Multiple Vitamins-Minerals (CENTRUM SILVER 50+WOMEN) TABS Take 1 tablet by mouth daily.    [provider]  Multiple Vitamins-Minerals (VITAMIN D3 COMPLETE PO) Take by mouth daily.    [provider]  naproxen sodium (ALEVE) 220 MG tablet Take 220 mg by mouth daily as needed.    [provider]  Rimegepant Sulfate (NURTEC) 75 MG TBDP 1 taken at the first sign of migraine.  No more than 1/day.  Not for frequent use. Patient not taking: Reported on 03/22/2023 09/21/22   Babs Sciara, MD  topiramate (TOPAMAX) 25 MG tablet Take 1 tablet (25 mg total) by mouth 2 (two) times daily. 03/22/23   Babs Sciara, MD    Family History Family History  Problem Relation Age of Onset   Coronary artery disease Father        Died with MI age 80   Heart attack Father    Hypertension Mother    Heart failure Mother    Hyperlipidemia Mother    Osteoporosis Mother    Other Mother        bowel issues   Hypertension Sister    Heart disease Brother    Hypertension Brother    Colon cancer Neg Hx     Social History Social History   Tobacco Use   Smoking status: Never   Smokeless tobacco: Never  Vaping Use   Vaping Use: Never used  Substance Use Topics   Alcohol use: No   Drug use: No     Allergies   Codeine, Reclast [zoledronic acid], Statins, and Fosamax [alendronate sodium]   Review of Systems Review of Systems Per HPI  Physical Exam Triage Vital Signs ED Triage Vitals  Enc Vitals Group     BP 06/10/23 0847 131/77     Pulse Rate 06/10/23 0847 71     Resp 06/10/23 0847 16     Temp 06/10/23 0847 98 F (36.7 C)     Temp src --      SpO2 06/10/23 0847 95 %     Weight --      Height --      Head Circumference --      Peak Flow --      Pain Score 06/10/23 0849 8      Pain Loc --      Pain Edu? --      Excl. in GC? --    No data found.  Updated Vital Signs BP 131/77   Pulse 71   Temp 98 F (36.7 C)   Resp 16   SpO2 95%   Visual Acuity Right Eye Distance:   Left Eye  Distance:   Bilateral Distance:    Right Eye Near:   Left Eye Near:    Bilateral Near:     Physical Exam Vitals and nursing note reviewed.  Constitutional:      General: She is not in acute distress.    Appearance: Normal appearance.  HENT:     Head: Normocephalic.  Eyes:     Extraocular Movements: Extraocular movements intact.     Pupils: Pupils are equal, round, and reactive to light.  Cardiovascular:     Rate and Rhythm: Normal rate and regular rhythm.     Pulses: Normal pulses.     Heart sounds: Normal heart sounds.  Pulmonary:     Effort: Pulmonary effort is normal. No respiratory distress.     Breath sounds: Normal breath sounds. No stridor. No wheezing, rhonchi or rales.  Abdominal:     General: Bowel sounds are normal.     Palpations: Abdomen is soft.     Tenderness: There is no abdominal tenderness.  Musculoskeletal:     Cervical back: Normal range of motion.  Lymphadenopathy:     Cervical: No cervical adenopathy.  Skin:    General: Skin is warm and dry.  Neurological:     General: No focal deficit present.     Mental Status: She is alert and oriented to person, place, and time.  Psychiatric:        Mood and Affect: Mood normal.        Behavior: Behavior normal.      UC Treatments / Results  Labs (all labs ordered are listed, but only abnormal results are displayed) Labs Reviewed  POCT URINALYSIS DIP (MANUAL ENTRY) - Abnormal; Notable for the following components:      Result Value   Blood, UA trace-lysed (*)    Leukocytes, UA Small (1+) (*)    All other components within normal limits  URINE CULTURE    EKG   Radiology No results found.  Procedures Procedures (including critical care time)  Medications Ordered in UC Medications -  No data to display  Initial Impression / Assessment and Plan / UC Course  I have reviewed the triage vital signs and the nursing notes.  Pertinent labs & imaging results that were available during my care of the patient were reviewed by me and considered in my medical decision making (see chart for details).  The patient is well-appearing, she is in no acute distress, vital signs are stable.  Urinalysis is positive for trace leukocytes and blood.  Urinalysis does not indicate an obvious urinary tract infection; however, based on the patient's symptoms, will treat empirically with Keflex 500 mg.  Supportive care recommendations were provided and discussed with the patient to include increasing fluids, allowing for plenty of rest, developing a toileting schedule, and over-the-counter Tylenol for pain or discomfort.  Patient was advised that a urine culture has been ordered.  If the culture is negative, she will be advised to stop the antibiotic.  If the culture indicates the need for different antibiotic, a new medication will be prescribed.  Patient was given strict ER follow-up precautions.  Patient is in agreement with this plan of care and verbalizes understanding.  All questions were answered.  Patient stable for discharge.  Final Clinical Impressions(s) / UC Diagnoses   Final diagnoses:  UTI symptoms     Discharge Instructions      The urinalysis does not indicate an obvious urinary tract infection.  A urine culture has been ordered.  If the results of the urine culture are negative, you will be advised to stop the antibiotic prescribed today.  If the results of the culture indicate that a different antibiotic is recommended, you will be contacted and provided a new prescription. Take medication as prescribed. Increase fluids.  Try to drink at least 8-10 8 ounce glasses of water daily. May take over-the-counter Tylenol as needed for pain, fever, or general discomfort. Develop a toileting  schedule that will allow you to urinate every 2 hours. Avoid caffeine such as tea, soda, and coffee while symptoms persist. If you continue to experience symptoms and your urine culture is negative, please follow-up with your primary care physician for further evaluation. If you develop worsening symptoms while taking the antibiotics such as fever, chills, flank pain, worsening low back pain, or other concerns, please go to the emergency department immediately for further evaluation. Follow-up as needed.     ED Prescriptions     Medication Sig Dispense Auth. Provider   cephALEXin (KEFLEX) 500 MG capsule Take 1 capsule (500 mg total) by mouth 2 (two) times daily for 7 days. 14 capsule Rachael Matthews, Sadie Haber, NP      PDMP not reviewed this encounter.   Abran Cantor, NP 06/10/23 [phone]

## 2023-06-10 NOTE — ED Triage Notes (Signed)
Pt c/o UTI sx's, pressure in the abdomin urinary frequency, urinary retention, burning with urination x 4 days

## 2023-06-11 LAB — URINE CULTURE: Culture: NO GROWTH

## 2023-06-12 NOTE — Progress Notes (Signed)
ATC pt x 1 attempt, left VM to adv RC pt sent home on atbc and can stop abx per provider instructions. Message sent to pt via MyChart.

## 2023-08-28 ENCOUNTER — Other Ambulatory Visit: Payer: Self-pay | Admitting: Family Medicine

## 2023-09-07 ENCOUNTER — Other Ambulatory Visit: Payer: Self-pay | Admitting: Family Medicine

## 2023-12-01 ENCOUNTER — Other Ambulatory Visit: Payer: Self-pay | Admitting: Family Medicine

## 2023-12-12 ENCOUNTER — Other Ambulatory Visit: Payer: Self-pay | Admitting: Family Medicine

## 2024-02-29 ENCOUNTER — Other Ambulatory Visit: Payer: Self-pay | Admitting: Family Medicine

## 2024-03-03 LAB — HM MAMMOGRAPHY

## 2024-03-04 ENCOUNTER — Encounter: Payer: Self-pay | Admitting: Family Medicine

## 2024-03-12 ENCOUNTER — Encounter: Payer: Self-pay | Admitting: Family Medicine

## 2024-04-03 ENCOUNTER — Encounter: Payer: Self-pay | Admitting: Family Medicine

## 2024-04-03 ENCOUNTER — Ambulatory Visit: Payer: Medicare HMO | Admitting: Family Medicine

## 2024-04-03 VITALS — BP 128/76 | HR 70 | Temp 97.7°F | Ht 59.5 in | Wt 124.0 lb

## 2024-04-03 DIAGNOSIS — D329 Benign neoplasm of meninges, unspecified: Secondary | ICD-10-CM | POA: Diagnosis not present

## 2024-04-03 DIAGNOSIS — Z0001 Encounter for general adult medical examination with abnormal findings: Secondary | ICD-10-CM

## 2024-04-03 DIAGNOSIS — Z79899 Other long term (current) drug therapy: Secondary | ICD-10-CM | POA: Diagnosis not present

## 2024-04-03 DIAGNOSIS — Z0000 Encounter for general adult medical examination without abnormal findings: Secondary | ICD-10-CM

## 2024-04-03 DIAGNOSIS — E782 Mixed hyperlipidemia: Secondary | ICD-10-CM

## 2024-04-03 NOTE — Progress Notes (Signed)
 Subjective:    Patient ID: Rachael Matthews, female    DOB: 1946/01/17, 78 y.o.   MRN: [id number]  HPI The patient comes in today for a wellness visit.    A review of their health history was completed.  A review of medications was also completed.  Any needed refills; none   Eating habits: good    Falls/  MVA accidents in past few months: no   Regular exercise: works regularly part time  Specialist pt sees on regular basis: none  Preventative health issues were discussed.   Additional concerns: none  Discussed the use of AI scribe software for clinical note transcription with the patient, who gave verbal consent to proceed.  History of Present Illness   Rachael Matthews is a 78 year old female who presents for a routine follow-up visit.  She experiences fatigue attributed to her work schedule, which involves working three to four days a week for nine to ten and a half hours each day.  A bone density scan last year showed T-scores of -2.6 in the femur, -2.9 in the neck of the femur, and -3.2 on the other side, indicating osteoporosis. On Prolia currently with Dr.Balen    She cannot tolerate statins, so she is on Repatha injections every two weeks for hyperlipidemia.  She has a history of a stable meningioma, which was last evaluated with a head scan last year, showing stability compared to an MRI from two years prior.  No issues with overall health, including no setbacks or problems, and nutrition is going well. She does not engage in physical activities on her days off due to household responsibilities. She received a flu shot in September or October and reports no blood in her bowel movements. Her family history includes her husband being diagnosed with diabetes in December, which led to dietary changes at home. 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 She visits the dentist every six months.       Review of Systems      Objective:   Physical Exam  General-in no acute distress Eyes-no discharge Lungs-respiratory rate normal, CTA CV-no murmurs,RRR Extremities skin warm dry no edema Neuro grossly normal Behavior normal, alert       Assessment & Plan:  Assessment and Plan    Meningioma Meningioma stable with no MRI changes over two years. - Schedule follow-up MRI in spring 2026.  Hyperlipidemia On Repatha due to statin intolerance. Vascular calcifications noted on mammogram. Cardiologists recommend risk factor management. - Continue Repatha injections every two weeks. - Order blood work to check cholesterol levels.  Osteoporosis Confirmed by T scores of -2.6 to -3.2. Prolia necessary to slow bone density loss and reduce fracture risk. - Continue Prolia injections every six months. - Schedule bone density test in 2026.  General Health Maintenance Healthy and active with good nutrition and up-to-date vaccinations. - Encourage self-care and rest. - Ensure regular physical activity. - Continue regular dental check-ups every six months.  Follow-up Plans for health maintenance and monitoring in place. - Order blood work for cholesterol and routine checks. - Update medication refills at [facility]. - Schedule follow-up appointment after blood work results are available.      1. Encounter for subsequent annual wellness visit (AWV) in Medicare patient (Primary) Adult wellness-complete.wellness physical was conducted today. Importance of diet and exercise were discussed in detail.  Importance of stress reduction and healthy living were discussed.  In addition to  this a discussion regarding safety was also covered.  We also reviewed over immunizations and gave recommendations regarding current immunization needed for age.   In addition to this additional areas were also touched on including: Preventative health exams needed:  Colonoscopy last one was 2016 none indicated currently  Patient  was advised yearly wellness exam  We did discuss the importance of continuing her osteoporosis treatment Also discussed her breast vascular calcification I do not feel that further cardiac testing warranted Patient not having any symptoms currently The main goal is keep blood pressure and cholesterol under good control Lab work ordered await results 2. Meningioma (HCC) Stable Will need follow-up imaging CAT scan next year Patient understands follow-up in 1 year  3. Mixed hyperlipidemia Very importantly continue Repatha patient does not tolerate statins - Lipid Panel - Basic Metabolic Panel  4. High risk medication use Continue current measures - Hepatic Function Panel - Basic Metabolic Panel  5. Annual physical exam Annual wellness visit overall patient doing well No sign of dementia preventative measures discussed with patient - Basic Metabolic Panel

## 2024-04-08 ENCOUNTER — Encounter: Payer: Self-pay | Admitting: Family Medicine

## 2024-04-08 LAB — HEPATIC FUNCTION PANEL
ALT: 11 IU/L (ref 0–32)
AST: 17 IU/L (ref 0–40)
Albumin: 4.2 g/dL (ref 3.8–4.8)
Alkaline Phosphatase: 55 IU/L (ref 44–121)
Bilirubin Total: 0.3 mg/dL (ref 0.0–1.2)
Bilirubin, Direct: 0.12 mg/dL (ref 0.00–0.40)
Total Protein: 6.9 g/dL (ref 6.0–8.5)

## 2024-04-08 LAB — BASIC METABOLIC PANEL WITH GFR
BUN/Creatinine Ratio: 28 (ref 12–28)
BUN: 22 mg/dL (ref 8–27)
CO2: 22 mmol/L (ref 20–29)
Calcium: 9.5 mg/dL (ref 8.7–10.3)
Chloride: 105 mmol/L (ref 96–106)
Creatinine, Ser: 0.79 mg/dL (ref 0.57–1.00)
Glucose: 82 mg/dL (ref 70–99)
Potassium: 4.6 mmol/L (ref 3.5–5.2)
Sodium: 141 mmol/L (ref 134–144)
eGFR: 76 mL/min/{1.73_m2} (ref >59)

## 2024-04-08 LAB — LIPID PANEL
Chol/HDL Ratio: 3.2 ratio (ref 0.0–4.4)
Cholesterol, Total: 146 mg/dL (ref 100–199)
HDL: 46 mg/dL (ref >39)
LDL Chol Calc (NIH): 80 mg/dL (ref 0–99)
Triglycerides: 108 mg/dL (ref 0–149)
VLDL Cholesterol Cal: 20 mg/dL (ref 5–40)

## 2024-05-31 ENCOUNTER — Other Ambulatory Visit: Payer: Self-pay | Admitting: Family Medicine

## 2024-06-02 ENCOUNTER — Other Ambulatory Visit: Payer: Self-pay | Admitting: Family Medicine

## 2024-06-02 MED ORDER — FAMOTIDINE 40 MG PO TABS: 40.0000 mg | ORAL_TABLET | Freq: Every day | ORAL | 0 refills | Status: DC

## 2024-06-02 NOTE — Telephone Encounter (Signed)
 Copied from CRM [id number]. Topic: Clinical - Medication Refill >> Jun 02, 2024  9:48 AM Sophia H wrote: Medication: famotidine  (PEPCID ) 40 MG tablet  Has the patient contacted their pharmacy? Yes (Agent: If no, request that the patient contact the pharmacy for the refill. If patient does not wish to contact the pharmacy document the reason why and proceed with request.) (Agent: If yes, when and what did the pharmacy advise?)  This is the patient's preferred pharmacy:  [id number] - [place],  - [address] [place] [address] Phone: [phone] Fax: [phone]  Is this the correct pharmacy for this prescription? Yes If no, delete pharmacy and type the correct one.   Has the prescription been filled recently? Yes  Is the patient out of the medication? No, has two tablets left.  Has the patient been seen for an appointment in the last year OR does the patient have an upcoming appointment? Yes  Can we respond through MyChart? No, patient prefers phone call.  Agent: Please be advised that Rx refills may take up to 3 business days. We ask that you follow-up with your pharmacy.

## 2024-06-14 ENCOUNTER — Other Ambulatory Visit: Payer: Self-pay | Admitting: Family Medicine

## 2024-09-04 ENCOUNTER — Other Ambulatory Visit: Payer: Self-pay | Admitting: Family Medicine

## 2024-09-19 ENCOUNTER — Other Ambulatory Visit: Payer: Self-pay | Admitting: Family Medicine

## 2024-09-19 ENCOUNTER — Telehealth: Payer: Self-pay | Admitting: Family Medicine

## 2024-09-19 MED ORDER — TOPIRAMATE 25 MG PO TABS: 25.0000 mg | ORAL_TABLET | Freq: Two times a day (BID) | ORAL | 3 refills | Status: DC

## 2024-09-19 NOTE — Telephone Encounter (Signed)
 Copied from CRM [id number]. Topic: Clinical - Medication Refill >> Sep 19, 2024  9:11 AM Donna BRAVO wrote: Medication:  topiramate  (TOPAMAX ) 25 MG tablet [[id number]] DISCONTINUED  Has the patient contacted their pharmacy? Yes Pharmacy stated they have sent a refill request to provider on 09/08/24  This is the patient's preferred pharmacy:   [id number] - [place], [place] - [address] AT [address] [place] [address] Phone: [phone] Fax: [phone]  Is this the correct pharmacy for this prescription? Yes If no, delete pharmacy and type the correct one.   Has the prescription been filled recently? No  Is the patient out of the medication? Yes  Has the patient been seen for an appointment in the last year OR does the patient have an upcoming appointment? Yes  Can we respond through MyChart? No  Agent: Please be advised that Rx refills may take up to 3 business days. We ask that you follow-up with your pharmacy.

## 2024-09-19 NOTE — Telephone Encounter (Signed)
 Completed.

## 2024-09-19 NOTE — Telephone Encounter (Signed)
 Refill on topiramate  (TOPAMAX ) 25 MG tablet   [facility]

## 2024-10-03 ENCOUNTER — Ambulatory Visit (INDEPENDENT_AMBULATORY_CARE_PROVIDER_SITE_OTHER): Admitting: Family Medicine

## 2024-10-03 VITALS — BP 128/80 | HR 72 | Temp 97.7°F | Ht 59.5 in | Wt 124.0 lb

## 2024-10-03 DIAGNOSIS — Z789 Other specified health status: Secondary | ICD-10-CM | POA: Diagnosis not present

## 2024-10-03 DIAGNOSIS — Z23 Encounter for immunization: Secondary | ICD-10-CM

## 2024-10-03 DIAGNOSIS — M810 Age-related osteoporosis without current pathological fracture: Secondary | ICD-10-CM

## 2024-10-03 DIAGNOSIS — Z818 Family history of other mental and behavioral disorders: Secondary | ICD-10-CM

## 2024-10-03 DIAGNOSIS — E559 Vitamin D deficiency, unspecified: Secondary | ICD-10-CM

## 2024-10-03 DIAGNOSIS — E782 Mixed hyperlipidemia: Secondary | ICD-10-CM

## 2024-10-03 DIAGNOSIS — Z79899 Other long term (current) drug therapy: Secondary | ICD-10-CM

## 2024-10-03 MED ORDER — FAMOTIDINE 40 MG PO TABS: 40.0000 mg | ORAL_TABLET | Freq: Every day | ORAL | 3 refills | Status: AC

## 2024-10-03 MED ORDER — REPATHA SURECLICK 140 MG/ML ~~LOC~~ SOAJ: SUBCUTANEOUS | 12 refills | Status: AC

## 2024-10-03 MED ORDER — TOPIRAMATE 25 MG PO TABS: 25.0000 mg | ORAL_TABLET | Freq: Two times a day (BID) | ORAL | 7 refills | Status: AC

## 2024-10-03 NOTE — Progress Notes (Signed)
   Subjective:    Patient ID: Rachael Matthews, female    DOB: September 12, 1946, 78 y.o.   MRN: [id number]  HPI 6 month follow up     Review of Systems     Objective:   Physical Exam        Assessment & Plan:

## 2024-10-03 NOTE — Progress Notes (Signed)
   Subjective:    Patient ID: Rachael Matthews, female    DOB: 1946/02/12, 79 y.o.   MRN: [id number]  HPI she does have a history of hyperlipidemia takes her medicine Repatha  on a regular basis does not tolerate statins Had some memory dysfunction with statins but no longer on statins her memory has been doing well lately She does have family history of dementia which scares her but denies any major setbacks or problems for herself Does take Pepcid  to help treat reflux and does well for that also takes topiramate  twice daily and states she is doing well with that She has a history of vitamin D  deficiency we need to check the level She is on Prolia every 6 months through endocrinology She is getting her flu shot today    Review of Systems     Objective:   Physical Exam General-in no acute distress Eyes-no discharge Lungs-respiratory rate normal, CTA CV-no murmurs,RRR Extremities skin warm dry no edema Neuro grossly normal Behavior normal, alert        Assessment & Plan:  1. Immunization due Today - Flu vaccine HIGH DOSE PF(Fluzone Trivalent)  2. Mixed hyperlipidemia (Primary) He is on Repatha  on a regular basis cost wise she is able to afford it - Lipid Profile  3. Age-related osteoporosis without current pathological fracture She is on ongoing treatment with Dr. Balin for osteoporosis every 6 month Prolia  4. Statin intolerance Cannot tolerate statins on Repatha   5. Vitamin D  deficiency Check vitamin D  level share result with Dr. Balin - Vitamin D  (25 hydroxy)  6. High risk medication use Labs work ordered await results - Basic Metabolic Panel (BMET) - Hepatic function panel  7. Family history of dementia Patient had family member had a hip fracture in their 27s and ended up with dementia also had another family member with dementia that scares her but she is not noticing any significant setbacks or problems she did have some memory issues related to statins but  that cleared up  Follow-up 6 months

## 2024-10-20 LAB — LIPID PANEL
Chol/HDL Ratio: 3.1 ratio (ref 0.0–4.4)
Cholesterol, Total: 162 mg/dL (ref 100–199)
HDL: 53 mg/dL (ref >39)
LDL Chol Calc (NIH): 77 mg/dL (ref 0–99)
Triglycerides: 189 mg/dL — ABNORMAL HIGH (ref 0–149)
VLDL Cholesterol Cal: 32 mg/dL (ref 5–40)

## 2024-10-20 LAB — HEPATIC FUNCTION PANEL
ALT: 11 IU/L (ref 0–32)
AST: 19 IU/L (ref 0–40)
Albumin: 4.3 g/dL (ref 3.8–4.8)
Alkaline Phosphatase: 48 IU/L — ABNORMAL LOW (ref 49–135)
Bilirubin Total: 0.4 mg/dL (ref 0.0–1.2)
Bilirubin, Direct: 0.12 mg/dL (ref 0.00–0.40)
Total Protein: 6.8 g/dL (ref 6.0–8.5)

## 2024-10-20 LAB — BASIC METABOLIC PANEL WITH GFR
BUN/Creatinine Ratio: 29 — ABNORMAL HIGH (ref 12–28)
BUN: 25 mg/dL (ref 8–27)
CO2: 22 mmol/L (ref 20–29)
Calcium: 9.8 mg/dL (ref 8.7–10.3)
Chloride: 104 mmol/L (ref 96–106)
Creatinine, Ser: 0.85 mg/dL (ref 0.57–1.00)
Glucose: 96 mg/dL (ref 70–99)
Potassium: 5.2 mmol/L (ref 3.5–5.2)
Sodium: 138 mmol/L (ref 134–144)
eGFR: 70 mL/min/1.73 (ref >59)

## 2024-10-20 LAB — VITAMIN D 25 HYDROXY (VIT D DEFICIENCY, FRACTURES): Vit D, 25-Hydroxy: 30.3 ng/mL (ref 30.0–100.0)

## 2024-10-22 ENCOUNTER — Ambulatory Visit: Payer: Self-pay | Admitting: Family Medicine

## 2025-04-03 ENCOUNTER — Ambulatory Visit: Admitting: Family Medicine
